# Patient Record
Sex: Female | Born: 1995 | Race: White | Hispanic: No | Marital: Single | Smoking: Never smoker
Health system: Southern US, Community
[De-identification: ages and names within clinical notes are randomized; demographics above are authoritative.]

## PROBLEM LIST (undated history)

## (undated) DIAGNOSIS — K219 Gastro-esophageal reflux disease without esophagitis: Secondary | ICD-10-CM

---

## 2019-06-05 ENCOUNTER — Encounter: Payer: Self-pay | Admitting: Emergency Medicine

## 2019-06-05 ENCOUNTER — Other Ambulatory Visit: Payer: Self-pay

## 2019-06-05 ENCOUNTER — Ambulatory Visit
Admission: EM | Admit: 2019-06-05 | Discharge: 2019-06-05 | Disposition: A | Discharge status: Home / Self Care | Attending: Emergency Medicine | Admitting: Emergency Medicine

## 2019-06-05 ENCOUNTER — Telehealth: Payer: Self-pay | Admitting: *Deleted

## 2019-06-05 DIAGNOSIS — R112 Nausea with vomiting, unspecified: Secondary | ICD-10-CM

## 2019-06-05 DIAGNOSIS — Z20822 Contact with and (suspected) exposure to covid-19: Secondary | ICD-10-CM

## 2019-06-05 DIAGNOSIS — Z3202 Encounter for pregnancy test, result negative: Secondary | ICD-10-CM

## 2019-06-05 DIAGNOSIS — Z7189 Other specified counseling: Secondary | ICD-10-CM

## 2019-06-05 HISTORY — DX: Gastro-esophageal reflux disease without esophagitis: K21.9

## 2019-06-05 LAB — PREGNANCY, URINE: Preg Test, Ur: NEGATIVE

## 2019-06-05 MED ORDER — ONDANSETRON 8 MG PO TBDP
8.0000 mg | ORAL_TABLET | Freq: Once | ORAL | Status: AC
  Administered 2019-06-05: 8 mg via ORAL

## 2019-06-05 MED ORDER — ONDANSETRON 4 MG PO TBDP: 4.0000 mg | ORAL_TABLET | Freq: Three times a day (TID) | ORAL | 0 refills | Status: DC | PRN

## 2019-06-05 NOTE — Discharge Instructions (Signed)
Take medication as prescribed. Rest. Drink plenty of fluids. Bland diet. Covid 19 testing today. Refer to [facility] information and follow. Remain home until results received. Over the counter pepcid for acid reflux as needed.   Follow up with your primary care physician this week. Return to Urgent care for new or worsening concerns.

## 2019-06-05 NOTE — Telephone Encounter (Signed)
Order placed for covid-19 testing. Patient instructions given. Appt today at the [facility] @ 10:30

## 2019-06-05 NOTE — ED Triage Notes (Signed)
Patient c/o nausea and vomiting off and on for over a week.  Patient denies fevers.

## 2019-06-05 NOTE — ED Provider Notes (Signed)
[facility] ____________________________________________  Time seen: Approximately 9:16 AM  I have reviewed the triage vital signs and the nursing notes.   HISTORY  Chief Complaint Nausea and Emesis   HPI Erika BiMcKenna Higgins is a 23 y.o. female has medical history of GERD, presenting for evaluation of nausea with vomiting today.  Patient reports the last 1 week she has had intermittent episodes of nausea, worse in the morning.  States today she had several back-to-back episodes of vomiting.  Last bowel movement was earlier today and described as loose but not diarrhea.  Has overall continued with normal bowel movements.  History of GERD with some similar presentation.  Does also work in [employer] around a lot of people.  Denies known food triggers.  Has had an occasional cough, but reports this is not abnormal for her.  Denies nasal congestion, sore throat, chest pain, shortness of breath.  States abdomen overall feels empty with intermittent discomfort and aching.  Denies current abdominal pain.  Denies abnormal color vomit or stool.  No dysuria.  Sexually active, has Implanon, denies pregnancy.  Has taken over-the-counter Tums with some improvement, denies other alleviating measures.Denies aggravating factors. Food doesn't taste as much. Drinking fluids well.   No LMP recorded. Patient has had an implant.   Past Medical History:  Diagnosis Date  . GERD (gastroesophageal reflux disease)     There are no active problems to display for this patient.   History reviewed. No pertinent surgical history.   No current facility-administered medications for this encounter.   Current Outpatient Medications:  .  etonogestrel (NEXPLANON) 68 MG IMPL implant, 1 each by Subdermal route once., Disp: , Rfl:  .  ondansetron (ZOFRAN ODT) 4 MG disintegrating tablet, Take 1 tablet (4 mg total) by mouth every 8 (eight) hours as needed for nausea or vomiting., Disp: 15 tablet, Rfl: 0   Allergies Patient has no known allergies.  Family History  Problem Relation Age of Onset  . Healthy Mother   . Healthy Father     Social History Social History   Tobacco Use  . Smoking status: Never Smoker  . Smokeless tobacco: Never Used  Substance Use Topics  . Alcohol use: Yes  . Drug use: Never    Review of Systems Constitutional: No fever ENT: No sore throat. Cardiovascular: Denies chest pain. Respiratory: Denies shortness of breath. Gastrointestinal: As above Genitourinary: Negative for dysuria. Musculoskeletal: Negative for back pain. Skin: Negative for rash. Neurological: Negative for headaches, focal weakness or numbness.   ____________________________________________   PHYSICAL EXAM:  VITAL SIGNS: ED Triage Vitals  Enc Vitals Group     BP 06/05/19 0901 123/81     Pulse Rate 06/05/19 0901 81     Resp 06/05/19 0901 16     Temp 06/05/19 0901 98.8 F (37.1 C)     Temp Source 06/05/19 0901 Oral     SpO2 06/05/19 0901 100 %     Weight 06/05/19 0856 159 lb (72.1 kg)     Height 06/05/19 0856 5\' 2"  (1.575 m)     Head Circumference --      Peak Flow --      Pain Score 06/05/19 0856 4     Pain Loc --      Pain Edu? --      Excl. in GC? --     Constitutional: Alert and oriented. Well appearing and in no acute distress. Eyes: Conjunctivae are normal.  ENT      Head: Normocephalic  and atraumatic.      Nose: No congestion      Mouth/Throat: Mucous membranes are moist.Oropharynx non-erythematous. Neck: No stridor. Supple without meningismus.  Hematological/Lymphatic/Immunilogical: No cervical lymphadenopathy. Cardiovascular: Normal rate, regular rhythm. Grossly normal heart sounds.  Good peripheral circulation. Respiratory: Normal respiratory effort without tachypnea nor retractions. Breath sounds are clear and equal bilaterally. No wheezes, rales, rhonchi. Gastrointestinal: Soft and nontender. Normal Bowel sounds. No CVA tenderness. Musculoskeletal:  No  midline cervical, thoracic or lumbar tenderness to palpation. Neurologic:  Normal speech and language.Speech is normal. No gait instability.  Skin:  Skin is warm, dry and intact. No rash noted. Psychiatric: Mood and affect are normal. Speech and behavior are normal. Patient exhibits appropriate insight and judgment   ___________________________________________   LABS (all labs ordered are listed, but only abnormal results are displayed)  Labs Reviewed  PREGNANCY, URINE   ____________________________________________   PROCEDURES Procedures    INITIAL IMPRESSION / ASSESSMENT AND PLAN / ED COURSE  Pertinent labs & imaging results that were available during my care of the patient were reviewed by me and considered in my medical decision making (see chart for details).  Well-appearing patient.  No acute distress.  Discussed multiple differentials with patient including viral gastroenteritis, GERD, pregnancy, COVID.  8 milligrams ODT Zofran given once in urgent care. Urine preg negative. Ordered Covid 19 testing. [place] information given and directed to follow. Supportive care. OTC pepcid as needed for GERD. Discussed indication, risks and benefits of medications with patient.  Discussed follow up with Primary care physician this week. Discussed follow up and return parameters including no resolution or any worsening concerns. Patient verbalized understanding and agreed to plan.   ____________________________________________   FINAL CLINICAL IMPRESSION(S) / ED DIAGNOSES  Final diagnoses:  Non-intractable vomiting with nausea, unspecified vomiting type  Advice Given About Covid-19 Virus Infection     ED Discharge Orders         Ordered    ondansetron (ZOFRAN ODT) 4 MG disintegrating tablet  Every 8 hours PRN     06/05/19 0944           Note: This dictation was prepared with Dragon dictation along with smaller phrase technology. Any transcriptional errors that result from  this process are unintentional.         Marylene Land, NP 06/05/19 [phone]

## 2019-06-05 NOTE — Telephone Encounter (Signed)
-----   Message from Marylene Land, NP sent at 06/05/2019  9:16 AM EDT ----- Regarding: covid 19 testing Nausea vomiting, decreased taste. Covid 19 testing

## 2019-06-06 LAB — NOVEL CORONAVIRUS, NAA: SARS-CoV-2, NAA: NOT DETECTED

## 2019-06-07 ENCOUNTER — Telehealth (HOSPITAL_COMMUNITY): Payer: Self-pay | Admitting: Emergency Medicine

## 2019-06-07 NOTE — Telephone Encounter (Signed)
Your test for COVID-19 was negative.  Please continue good preventive care measures, including:  frequent hand-washing, avoid touching your face, cover coughs/sneezes, stay out of crowds and keep a 6 foot distance from others.  If you develop fever/cough/breathlessness, please stay home for 10 days and until you have had 3 consecutive days with cough/breathlessness improving and without fever (without taking a fever reducer). Go to the nearest hospital ED tent for assessment if fever/cough/breathlessness are severe or illness seems like a threat to life..  Patient contacted and made aware of all results, all questions answered.  

## 2019-08-10 ENCOUNTER — Emergency Department

## 2019-08-10 ENCOUNTER — Ambulatory Visit
Admission: EM | Admit: 2019-08-10 | Discharge: 2019-08-10 | Disposition: A | Discharge status: Nursing Home (Includes ICF) | Source: Home / Self Care

## 2019-08-10 ENCOUNTER — Other Ambulatory Visit: Payer: Self-pay

## 2019-08-10 ENCOUNTER — Emergency Department
Admission: EM | Admit: 2019-08-10 | Discharge: 2019-08-10 | Disposition: A | Discharge status: Home / Self Care | Attending: Student in an Organized Health Care Education/Training Program | Admitting: Student in an Organized Health Care Education/Training Program

## 2019-08-10 DIAGNOSIS — R1011 Right upper quadrant pain: Secondary | ICD-10-CM | POA: Insufficient documentation

## 2019-08-10 DIAGNOSIS — E860 Dehydration: Secondary | ICD-10-CM | POA: Insufficient documentation

## 2019-08-10 DIAGNOSIS — R112 Nausea with vomiting, unspecified: Secondary | ICD-10-CM

## 2019-08-10 LAB — URINE DRUG SCREEN, QUALITATIVE (ARMC ONLY)
Amphetamines, Ur Screen: NOT DETECTED
Barbiturates, Ur Screen: NOT DETECTED
Benzodiazepine, Ur Scrn: NOT DETECTED
Cannabinoid 50 Ng, Ur ~~LOC~~: POSITIVE — AB
Cocaine Metabolite,Ur ~~LOC~~: NOT DETECTED
MDMA (Ecstasy)Ur Screen: NOT DETECTED
Methadone Scn, Ur: NOT DETECTED
Opiate, Ur Screen: NOT DETECTED
Phencyclidine (PCP) Ur S: NOT DETECTED
Tricyclic, Ur Screen: NOT DETECTED

## 2019-08-10 LAB — COMPREHENSIVE METABOLIC PANEL
ALT: 19 U/L (ref 0–44)
AST: 21 U/L (ref 15–41)
Albumin: 5.1 g/dL — ABNORMAL HIGH (ref 3.5–5.0)
Alkaline Phosphatase: 108 U/L (ref 38–126)
Anion gap: 12 (ref 5–15)
BUN: 21 mg/dL — ABNORMAL HIGH (ref 6–20)
CO2: 22 mmol/L (ref 22–32)
Calcium: 9.7 mg/dL (ref 8.9–10.3)
Chloride: 102 mmol/L (ref 98–111)
Creatinine, Ser: 0.73 mg/dL (ref 0.44–1.00)
GFR calc Af Amer: >60 mL/min (ref >60)
GFR calc non Af Amer: >60 mL/min (ref >60)
Glucose, Bld: 129 mg/dL — ABNORMAL HIGH (ref 70–99)
Potassium: 3.6 mmol/L (ref 3.5–5.1)
Sodium: 136 mmol/L (ref 135–145)
Total Bilirubin: 2 mg/dL — ABNORMAL HIGH (ref 0.3–1.2)
Total Protein: 8.8 g/dL — ABNORMAL HIGH (ref 6.5–8.1)

## 2019-08-10 LAB — CBC
HCT: 46.6 % — ABNORMAL HIGH (ref 36.0–46.0)
Hemoglobin: 16 g/dL — ABNORMAL HIGH (ref 12.0–15.0)
MCH: 29.4 pg (ref 26.0–34.0)
MCHC: 34.3 g/dL (ref 30.0–36.0)
MCV: 85.7 fL (ref 80.0–100.0)
Platelets: 320 10*3/uL (ref 150–400)
RBC: 5.44 MIL/uL — ABNORMAL HIGH (ref 3.87–5.11)
RDW: 12.4 % (ref 11.5–15.5)
WBC: 15.1 10*3/uL — ABNORMAL HIGH (ref 4.0–10.5)
nRBC: 0 % (ref 0.0–0.2)

## 2019-08-10 LAB — URINALYSIS, COMPLETE (UACMP) WITH MICROSCOPIC
Bacteria, UA: NONE SEEN
Bilirubin Urine: NEGATIVE
Glucose, UA: 150 mg/dL — AB
Ketones, ur: 80 mg/dL — AB
Leukocytes,Ua: NEGATIVE
Nitrite: NEGATIVE
Protein, ur: 30 mg/dL — AB
Specific Gravity, Urine: 1.028 (ref 1.005–1.030)
pH: 6 (ref 5.0–8.0)

## 2019-08-10 LAB — POCT PREGNANCY, URINE: Preg Test, Ur: NEGATIVE

## 2019-08-10 LAB — LIPASE, BLOOD: Lipase: 17 U/L (ref 11–51)

## 2019-08-10 MED ORDER — DEXTROSE 5 % AND 0.45 % NACL IV BOLUS
1000.0000 mL | Freq: Once | INTRAVENOUS | Status: AC
  Administered 2019-08-10: 13:00:00 1000 mL via INTRAVENOUS

## 2019-08-10 MED ORDER — PROMETHAZINE HCL 25 MG/ML IJ SOLN
12.5000 mg | Freq: Four times a day (QID) | INTRAMUSCULAR | Status: DC | PRN
  Administered 2019-08-10: 15:00:00 12.5 mg via INTRAVENOUS
  Filled 2019-08-10: qty 1

## 2019-08-10 MED ORDER — PANTOPRAZOLE SODIUM 40 MG IV SOLR
40.0000 mg | Freq: Once | INTRAVENOUS | Status: AC
  Administered 2019-08-10: 15:00:00 40 mg via INTRAVENOUS
  Filled 2019-08-10: qty 40

## 2019-08-10 MED ORDER — ONDANSETRON HCL 4 MG/2ML IJ SOLN
4.0000 mg | Freq: Once | INTRAMUSCULAR | Status: AC | PRN
  Administered 2019-08-10: 4 mg via INTRAVENOUS
  Filled 2019-08-10: qty 2

## 2019-08-10 MED ORDER — SODIUM CHLORIDE 0.9% FLUSH: 3.0000 mL | Freq: Once | INTRAVENOUS | Status: DC

## 2019-08-10 MED ORDER — SODIUM CHLORIDE 0.9 % IV BOLUS
1000.0000 mL | Freq: Once | INTRAVENOUS | Status: AC
  Administered 2019-08-10: 15:00:00 1000 mL via INTRAVENOUS

## 2019-08-10 MED ORDER — PROMETHAZINE HCL 12.5 MG PO TABS: 12.5000 mg | ORAL_TABLET | Freq: Four times a day (QID) | ORAL | 0 refills | Status: DC | PRN

## 2019-08-10 MED ORDER — HALOPERIDOL LACTATE 5 MG/ML IJ SOLN
5.0000 mg | Freq: Once | INTRAMUSCULAR | Status: AC
  Administered 2019-08-10: 17:00:00 5 mg via INTRAMUSCULAR
  Filled 2019-08-10: qty 1

## 2019-08-10 NOTE — ED Notes (Signed)
Pt given cup of water and prompted to try and drink lots of fluids.

## 2019-08-10 NOTE — ED Notes (Signed)
Pt groaning/moaning and turning legs side to side in bed. Significant other at bedside. Pt states has thrown up "too many times to count." Pt has emesis bag. Pt not currently vomiting but states nausea only slightly dec since arrival.

## 2019-08-10 NOTE — ED Triage Notes (Signed)
Pt arrives to ed via pov from home. Pt reports drink last night and been vomiting since, pt unable to keep anything down. Pt in floor in triage room over waste bin vomiting. Pt states she only drank 2 glasses of wine but doesn't normally drink. Pt states emesis now has a pink tinge. Pt able to speak in full sentences, no SOB noted, pt appears weak but a&o x 4 and able to follow command freely

## 2019-08-10 NOTE — ED Notes (Signed)
Pt relaxed. Resting comfortably in bed. Rails up. Bed locked low. Call bell within reach.

## 2019-08-10 NOTE — ED Notes (Signed)
Pt signed printed d/c paperwork.  

## 2019-08-10 NOTE — ED Notes (Signed)
Pt up to bedside toilet in attempt to provide urine sample.

## 2019-08-10 NOTE — ED Provider Notes (Signed)
[facility] Emergency Department Provider Note    First MD Initiated Contact with Patient 08/10/19 1415     (approximate)  I have reviewed the triage vital signs and the nursing notes.   HISTORY  Chief Complaint Dehydration and Emesis    HPI Erika Higgins is a 23 y.o. female with the below listed past medical history presents the ER for evaluation of crampy abdominal pain associated with nausea vomiting and decreased oral intake over the past 48 hours.  States that symptoms started after she had a sandwich for dinner and then 2 glasses of wine.  Has not had any diarrhea or measured fevers.  Does have some crampy right upper quadrant abdominal pain.  No lower quadrant pain.  Denies any discharge or dysuria.  Feels very weak and dehydrated.    Past Medical History:  Diagnosis Date   GERD (gastroesophageal reflux disease)    Family History  Problem Relation Age of Onset   Healthy Mother    Healthy Father    No past surgical history on file. There are no active problems to display for this patient.     Prior to Admission medications   Medication Sig Start Date End Date Taking? Authorizing Provider  etonogestrel (NEXPLANON) 68 MG IMPL implant 1 each by Subdermal route once.    [provider]  ondansetron (ZOFRAN ODT) 4 MG disintegrating tablet Take 1 tablet (4 mg total) by mouth every 8 (eight) hours as needed for nausea or vomiting. 06/05/19   Marylene Land, NP  promethazine (PHENERGAN) 12.5 MG tablet Take 1 tablet (12.5 mg total) by mouth every 6 (six) hours as needed. 08/10/19   Merlyn Lot, MD    Allergies Patient has no known allergies.    Social History Social History   Tobacco Use   Smoking status: Never Smoker   Smokeless tobacco: Never Used  Substance Use Topics   Alcohol use: Yes   Drug use: Never    Review of Systems Patient denies headaches, rhinorrhea, blurry vision, numbness, shortness of breath,  chest pain, edema, cough, abdominal pain, nausea, vomiting, diarrhea, dysuria, fevers, rashes or hallucinations unless otherwise stated above in HPI. ____________________________________________   PHYSICAL EXAM:  VITAL SIGNS: Vitals:   08/10/19 1731 08/10/19 1826  BP: (!) 140/96 (!) 117/57  Pulse: 61 83  Resp:  19  Temp:    SpO2: 99% 100%    Constitutional: Alert and oriented. Weak and dehydrated appearing Eyes: Conjunctivae are normal.  Head: Atraumatic. Nose: No congestion/rhinnorhea. Mouth/Throat: Mucous membranes are moist.   Neck: No stridor. Painless ROM.  Cardiovascular: Normal rate, regular rhythm. Grossly normal heart sounds.  Good peripheral circulation. Respiratory: Normal respiratory effort.  No retractions. Lungs CTAB. Gastrointestinal: Soft with mild ruq ttp. No distention. No abdominal bruits. No CVA tenderness. Genitourinary: deferred Musculoskeletal: No lower extremity tenderness nor edema.  No joint effusions. Neurologic:  Normal speech and language. No gross focal neurologic deficits are appreciated. No facial droop Skin:  Skin is warm, dry and intact. No rash noted. Psychiatric: Mood and affect are normal. Speech and behavior are normal.  ____________________________________________   LABS (all labs ordered are listed, but only abnormal results are displayed)  Results for orders placed or performed during the hospital encounter of 08/10/19 (from the past 24 hour(s))  Lipase, blood     Status: None   Collection Time: 08/10/19  1:08 PM  Result Value Ref Range   Lipase 17 11 - 51 U/L  Comprehensive metabolic panel  Status: Abnormal   Collection Time: 08/10/19  1:08 PM  Result Value Ref Range   Sodium 136 135 - 145 mmol/L   Potassium 3.6 3.5 - 5.1 mmol/L   Chloride 102 98 - 111 mmol/L   CO2 22 22 - 32 mmol/L   Glucose, Bld 129 (H) 70 - 99 mg/dL   BUN 21 (H) 6 - 20 mg/dL   Creatinine, Ser 1.610.73 0.44 - 1.00 mg/dL   Calcium 9.7 8.9 - 09.610.3 mg/dL    Total Protein 8.8 (H) 6.5 - 8.1 g/dL   Albumin 5.1 (H) 3.5 - 5.0 g/dL   AST 21 15 - 41 U/L   ALT 19 0 - 44 U/L   Alkaline Phosphatase 108 38 - 126 U/L   Total Bilirubin 2.0 (H) 0.3 - 1.2 mg/dL   GFR calc non Af Amer >60 >60 mL/min   GFR calc Af Amer >60 >60 mL/min   Anion gap 12 5 - 15  CBC     Status: Abnormal   Collection Time: 08/10/19  1:08 PM  Result Value Ref Range   WBC 15.1 (H) 4.0 - 10.5 K/uL   RBC 5.44 (H) 3.87 - 5.11 MIL/uL   Hemoglobin 16.0 (H) 12.0 - 15.0 g/dL   HCT 04.546.6 (H) 40.936.0 - 81.146.0 %   MCV 85.7 80.0 - 100.0 fL   MCH 29.4 26.0 - 34.0 pg   MCHC 34.3 30.0 - 36.0 g/dL   RDW 91.412.4 78.211.5 - 95.615.5 %   Platelets 320 150 - 400 K/uL   nRBC 0.0 0.0 - 0.2 %  Urinalysis, Complete w Microscopic     Status: Abnormal   Collection Time: 08/10/19  4:10 PM  Result Value Ref Range   Color, Urine YELLOW (A) YELLOW   APPearance CLOUDY (A) CLEAR   Specific Gravity, Urine 1.028 1.005 - 1.030   pH 6.0 5.0 - 8.0   Glucose, UA 150 (A) NEGATIVE mg/dL   Hgb urine dipstick MODERATE (A) NEGATIVE   Bilirubin Urine NEGATIVE NEGATIVE   Ketones, ur 80 (A) NEGATIVE mg/dL   Protein, ur 30 (A) NEGATIVE mg/dL   Nitrite NEGATIVE NEGATIVE   Leukocytes,Ua NEGATIVE NEGATIVE   RBC / HPF 0-5 0 - 5 RBC/hpf   WBC, UA 0-5 0 - 5 WBC/hpf   Bacteria, UA NONE SEEN NONE SEEN   Squamous Epithelial / LPF 0-5 0 - 5   Mucus PRESENT   Urine Drug Screen, Qualitative ([facility] only)     Status: Abnormal   Collection Time: 08/10/19  4:10 PM  Result Value Ref Range   Tricyclic, Ur Screen NONE DETECTED NONE DETECTED   Amphetamines, Ur Screen NONE DETECTED NONE DETECTED   MDMA (Ecstasy)Ur Screen NONE DETECTED NONE DETECTED   Cocaine Metabolite,Ur [place] NONE DETECTED NONE DETECTED   Opiate, Ur Screen NONE DETECTED NONE DETECTED   Phencyclidine (PCP) Ur S NONE DETECTED NONE DETECTED   Cannabinoid 50 Ng, Ur [place] POSITIVE (A) NONE DETECTED   Barbiturates, Ur Screen NONE DETECTED NONE DETECTED   Benzodiazepine, Ur Scrn NONE  DETECTED NONE DETECTED   Methadone Scn, Ur NONE DETECTED NONE DETECTED  Pregnancy, urine POC     Status: None   Collection Time: 08/10/19  4:16 PM  Result Value Ref Range   Preg Test, Ur NEGATIVE NEGATIVE   ____________________________________________ ________________________________  RADIOLOGY  I personally reviewed all radiographic images ordered to evaluate for the above acute complaints and reviewed radiology reports and findings.  These findings were personally discussed with the patient.  Please see medical record for radiology report.  ____________________________________________   PROCEDURES  Procedure(s) performed:  Procedures    Critical Care performed: no ____________________________________________   INITIAL IMPRESSION / ASSESSMENT AND PLAN / ED COURSE  Pertinent labs & imaging results that were available during my care of the patient were reviewed by me and considered in my medical decision making (see chart for details).   DDX: Dehydration, lecture light abnormality, DKA, hyperemesis, pregnancy, pancreatitis, alcoholic gastritis  Erika Higgins is a 23 y.o. who presents to the ED with symptoms as described above.  Patient with some mild right upper quadrant abdominal pain in the setting of her 2 days of nausea and vomiting.  No lower domino pain.  This not consistent with appendicitis.  Will give IV fluids as well as antiemetics.  Order ultrasound.  Will reassess.  Clinical Course as of Aug 09 1852  Sat Aug 10, 2019  1712 UDS is positive for cannabinoids.  I do suspect this is worsening her symptoms..  Repeat abdominal exam is soft and benign.  She is not showing any signs of sepsis.   [PR]  1722 Patient requesting additional antiemetic though she is not having any vomiting.  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   [PR]  1812 Patient received over 2 L of fluid.  She is not  having any dry heaving or vomiting for the past 5-1/2 hours.  Repeat abdominal exam is soft and benign.  At this point I do believe she is stable and appropriate for outpatient follow-up.   [PR]    Clinical Course User Index [PR] Willy Eddyobinson, Naphtali Riede, MD    The patient was evaluated in Emergency Department today for the symptoms described in the history of present illness. He/she was evaluated in the context of the global COVID-19 pandemic, which necessitated consideration that the patient might be at risk for infection with the SARS-CoV-2 virus that causes COVID-19. Institutional protocols and algorithms that pertain to the evaluation of patients at risk for COVID-19 are in a state of rapid change based on information released by regulatory bodies including the CDC and federal and state organizations. These policies and algorithms were followed during the patient's care in the ED.  As part of my medical decision making, I reviewed the following data within the electronic MEDICAL RECORD NUMBER Nursing notes reviewed and incorporated, Labs reviewed, notes from prior ED visits and [place] Controlled Substance Database   ____________________________________________   FINAL CLINICAL IMPRESSION(S) / ED DIAGNOSES  Final diagnoses:  RUQ pain  Non-intractable vomiting with nausea, unspecified vomiting type  Dehydration      NEW MEDICATIONS STARTED DURING THIS VISIT:  Discharge Medication List as of 08/10/2019  6:23 PM    START taking these medications   Details  promethazine (PHENERGAN) 12.5 MG tablet Take 1 tablet (12.5 mg total) by mouth every 6 (six) hours as needed., Starting Sat 08/10/2019, Normal         Note:  This document was prepared using Dragon voice recognition software and may include unintentional dictation errors.    Willy Eddyobinson, Sonja Manseau, MD 08/10/19 [phone]

## 2019-09-05 ENCOUNTER — Emergency Department: Payer: Self-pay

## 2019-09-05 ENCOUNTER — Other Ambulatory Visit: Payer: Self-pay

## 2019-09-05 ENCOUNTER — Emergency Department
Admission: EM | Admit: 2019-09-05 | Discharge: 2019-09-05 | Disposition: A | Discharge status: Home / Self Care | Payer: Self-pay | Attending: Emergency Medicine | Admitting: Emergency Medicine

## 2019-09-05 DIAGNOSIS — Z793 Long term (current) use of hormonal contraceptives: Secondary | ICD-10-CM | POA: Insufficient documentation

## 2019-09-05 DIAGNOSIS — R1115 Cyclical vomiting syndrome unrelated to migraine: Secondary | ICD-10-CM

## 2019-09-05 DIAGNOSIS — R1011 Right upper quadrant pain: Secondary | ICD-10-CM

## 2019-09-05 DIAGNOSIS — F12188 Cannabis abuse with other cannabis-induced disorder: Secondary | ICD-10-CM | POA: Insufficient documentation

## 2019-09-05 LAB — COMPREHENSIVE METABOLIC PANEL
ALT: 19 U/L (ref 0–44)
AST: 19 U/L (ref 15–41)
Albumin: 5.3 g/dL — ABNORMAL HIGH (ref 3.5–5.0)
Alkaline Phosphatase: 105 U/L (ref 38–126)
Anion gap: 12 (ref 5–15)
BUN: 18 mg/dL (ref 6–20)
CO2: 21 mmol/L — ABNORMAL LOW (ref 22–32)
Calcium: 10.2 mg/dL (ref 8.9–10.3)
Chloride: 103 mmol/L (ref 98–111)
Creatinine, Ser: 0.75 mg/dL (ref 0.44–1.00)
GFR calc Af Amer: >60 mL/min (ref >60)
GFR calc non Af Amer: >60 mL/min (ref >60)
Glucose, Bld: 121 mg/dL — ABNORMAL HIGH (ref 70–99)
Potassium: 3.4 mmol/L — ABNORMAL LOW (ref 3.5–5.1)
Sodium: 136 mmol/L (ref 135–145)
Total Bilirubin: 1.7 mg/dL — ABNORMAL HIGH (ref 0.3–1.2)
Total Protein: 8.6 g/dL — ABNORMAL HIGH (ref 6.5–8.1)

## 2019-09-05 LAB — URINALYSIS, COMPLETE (UACMP) WITH MICROSCOPIC
Bacteria, UA: NONE SEEN
Bilirubin Urine: NEGATIVE
Glucose, UA: NEGATIVE mg/dL
Hgb urine dipstick: NEGATIVE
Ketones, ur: 20 mg/dL — AB
Leukocytes,Ua: NEGATIVE
Nitrite: NEGATIVE
Protein, ur: NEGATIVE mg/dL
Specific Gravity, Urine: 1.013 (ref 1.005–1.030)
pH: 6 (ref 5.0–8.0)

## 2019-09-05 LAB — CBC
HCT: 45.7 % (ref 36.0–46.0)
Hemoglobin: 15.8 g/dL — ABNORMAL HIGH (ref 12.0–15.0)
MCH: 29.8 pg (ref 26.0–34.0)
MCHC: 34.6 g/dL (ref 30.0–36.0)
MCV: 86.1 fL (ref 80.0–100.0)
Platelets: 347 10*3/uL (ref 150–400)
RBC: 5.31 MIL/uL — ABNORMAL HIGH (ref 3.87–5.11)
RDW: 12.5 % (ref 11.5–15.5)
WBC: 15.9 10*3/uL — ABNORMAL HIGH (ref 4.0–10.5)
nRBC: 0 % (ref 0.0–0.2)

## 2019-09-05 LAB — LIPASE, BLOOD: Lipase: 19 U/L (ref 11–51)

## 2019-09-05 LAB — POCT PREGNANCY, URINE: Preg Test, Ur: NEGATIVE

## 2019-09-05 MED ORDER — ALUM & MAG HYDROXIDE-SIMETH 200-200-20 MG/5ML PO SUSP
30.0000 mL | Freq: Once | ORAL | Status: AC
  Administered 2019-09-05: 20:00:00 30 mL via ORAL
  Filled 2019-09-05: qty 30

## 2019-09-05 MED ORDER — PROCHLORPERAZINE EDISYLATE 10 MG/2ML IJ SOLN
10.0000 mg | Freq: Once | INTRAMUSCULAR | Status: AC
  Administered 2019-09-05: 10 mg via INTRAVENOUS
  Filled 2019-09-05: qty 2

## 2019-09-05 MED ORDER — POTASSIUM CHLORIDE 10 MEQ/100ML IV SOLN
10.0000 meq | Freq: Once | INTRAVENOUS | Status: AC
  Administered 2019-09-05: 10 meq via INTRAVENOUS
  Filled 2019-09-05: qty 100

## 2019-09-05 MED ORDER — ONDANSETRON HCL 4 MG/2ML IJ SOLN
4.0000 mg | Freq: Once | INTRAMUSCULAR | Status: AC
  Administered 2019-09-05: 4 mg via INTRAVENOUS
  Filled 2019-09-05: qty 2

## 2019-09-05 MED ORDER — HALOPERIDOL LACTATE 5 MG/ML IJ SOLN
5.0000 mg | Freq: Once | INTRAMUSCULAR | Status: AC
  Administered 2019-09-05: 19:00:00 5 mg via INTRAVENOUS
  Filled 2019-09-05: qty 1

## 2019-09-05 MED ORDER — LIDOCAINE VISCOUS HCL 2 % MT SOLN
15.0000 mL | Freq: Once | OROMUCOSAL | Status: AC
  Administered 2019-09-05: 20:00:00 15 mL via ORAL
  Filled 2019-09-05: qty 15

## 2019-09-05 MED ORDER — CAPSAICIN 0.025 % EX CREA
TOPICAL_CREAM | Freq: Three times a day (TID) | CUTANEOUS | Status: DC
  Filled 2019-09-05 (×2): qty 60

## 2019-09-05 MED ORDER — SODIUM CHLORIDE 0.9 % IV BOLUS
1000.0000 mL | Freq: Once | INTRAVENOUS | Status: AC
  Administered 2019-09-05: 17:00:00 1000 mL via INTRAVENOUS

## 2019-09-05 MED ORDER — SODIUM CHLORIDE 0.9 % IV BOLUS
1000.0000 mL | Freq: Once | INTRAVENOUS | Status: AC
  Administered 2019-09-05: 19:00:00 1000 mL via INTRAVENOUS

## 2019-09-05 MED ORDER — DIPHENHYDRAMINE HCL 50 MG/ML IJ SOLN
25.0000 mg | Freq: Once | INTRAMUSCULAR | Status: AC
  Administered 2019-09-05: 25 mg via INTRAVENOUS
  Filled 2019-09-05: qty 1

## 2019-09-05 MED ORDER — PROMETHAZINE HCL 25 MG PO TABS: 25.0000 mg | ORAL_TABLET | Freq: Four times a day (QID) | ORAL | 0 refills | Status: AC | PRN

## 2019-09-05 MED ORDER — CAPSAICIN 0.075 % EX CREA
TOPICAL_CREAM | Freq: Three times a day (TID) | CUTANEOUS | Status: DC
  Filled 2019-09-05 (×3): qty 60

## 2019-09-05 NOTE — ED Notes (Signed)
Pt aware urine sample needed 

## 2019-09-05 NOTE — ED Notes (Addendum)
Went to offer patient something to drink or eat and she states she drank some water and threw it all up. Pt reports "it hurts so bad my body is shaking", pt shaking in bed, VSS at this time, Dr. Ellender Hose informed

## 2019-09-05 NOTE — ED Notes (Addendum)
Pt sleeping when I walked into the room, significant other at bedside. Pt continuously takes bp cuff and pulse ox off

## 2019-09-05 NOTE — ED Notes (Signed)
Pt resting with eyes closed, resps even and unlabored 

## 2019-09-05 NOTE — ED Notes (Signed)
Unable to provide urine sample

## 2019-09-05 NOTE — ED Provider Notes (Signed)
[facility] Emergency Department Provider Note  ____________________________________________   First MD Initiated Contact with Patient 09/05/19 1835     (approximate)  I have reviewed the triage vital signs and the nursing notes.   HISTORY  Chief Complaint Emesis and Abdominal Pain    HPI Erika Higgins is a 23 y.o. female with past medical history as below here with nausea, vomiting, and abdominal pain.  Patient states that her symptoms started yesterday morning.  She states that she had a marijuana edible prior to this.  She states that since then, she said persistently worsening nausea, vomiting, and epigastric pain.  She been unable to eat or drink.  No diarrhea.  No fevers or chills.  The pain is primarily epigastric, occasionally radiates up towards the right upper quadrant.  She was seen previously for this, and was UDS positive, felt better after fluids and antiemetics.  No fevers.  No urinary symptoms.  No vaginal bleeding or discharge.        Past Medical History:  Diagnosis Date  . GERD (gastroesophageal reflux disease)     There are no active problems to display for this patient.   History reviewed. No pertinent surgical history.  Prior to Admission medications   Medication Sig Start Date End Date Taking? Authorizing Provider  etonogestrel (NEXPLANON) 68 MG IMPL implant 1 each by Subdermal route once.    [provider]  promethazine (PHENERGAN) 25 MG tablet Take 1 tablet (25 mg total) by mouth every 6 (six) hours as needed for nausea or vomiting. 09/05/19   Shaune Pollack, MD    Allergies Patient has no known allergies.  Family History  Problem Relation Age of Onset  . Healthy Mother   . Healthy Father     Social History Social History   Tobacco Use  . Smoking status: Never Smoker  . Smokeless tobacco: Never Used  Substance Use Topics  . Alcohol use: Yes  . Drug use: Never    Review of Systems  Review of  Systems  Constitutional: Positive for fatigue. Negative for fever.  HENT: Negative for congestion and sore throat.   Eyes: Negative for visual disturbance.  Respiratory: Negative for cough and shortness of breath.   Cardiovascular: Negative for chest pain.  Gastrointestinal: Positive for abdominal pain, nausea and vomiting. Negative for diarrhea.  Genitourinary: Negative for flank pain.  Musculoskeletal: Negative for back pain and neck pain.  Skin: Negative for rash and wound.  Neurological: Positive for weakness.  All other systems reviewed and are negative.    ____________________________________________  PHYSICAL EXAM:      VITAL SIGNS: ED Triage Vitals [09/05/19 1620]  Enc Vitals Group     BP 139/78     Pulse Rate 86     Resp 18     Temp 98.8 F (37.1 C)     Temp Source Oral     SpO2 100 %     Weight 154 lb 5.2 oz (70 kg)     Height 5\' 2"  (1.575 m)     Head Circumference      Peak Flow      Pain Score 4     Pain Loc      Pain Edu?      Excl. in [place]?      Physical Exam Vitals signs and nursing note reviewed.  Constitutional:      General: She is not in acute distress.    Appearance: She is well-developed.  HENT:  Head: Normocephalic and atraumatic.  Eyes:     Conjunctiva/sclera: Conjunctivae normal.  Neck:     Musculoskeletal: Neck supple.  Cardiovascular:     Rate and Rhythm: Normal rate and regular rhythm.     Heart sounds: Normal heart sounds. No murmur. No friction rub.  Pulmonary:     Effort: Pulmonary effort is normal. No respiratory distress.     Breath sounds: Normal breath sounds. No wheezing or rales.  Abdominal:     General: There is no distension.     Palpations: Abdomen is soft.     Tenderness: There is abdominal tenderness in the right upper quadrant and epigastric area. There is no guarding or rebound. Negative signs include Murphy's sign.  Skin:    General: Skin is warm.     Capillary Refill: Capillary refill takes less than 2  seconds.  Neurological:     Mental Status: She is alert and oriented to person, place, and time.     Motor: No abnormal muscle tone.       ____________________________________________   LABS (all labs ordered are listed, but only abnormal results are displayed)  Labs Reviewed  COMPREHENSIVE METABOLIC PANEL - Abnormal; Notable for the following components:      Result Value   Potassium 3.4 (*)    CO2 21 (*)    Glucose, Bld 121 (*)    Total Protein 8.6 (*)    Albumin 5.3 (*)    Total Bilirubin 1.7 (*)    All other components within normal limits  CBC - Abnormal; Notable for the following components:   WBC 15.9 (*)    RBC 5.31 (*)    Hemoglobin 15.8 (*)    All other components within normal limits  LIPASE, BLOOD  URINALYSIS, COMPLETE (UACMP) WITH MICROSCOPIC  POC URINE PREG, ED  POCT PREGNANCY, URINE    ____________________________________________  EKG: None ________________________________________  RADIOLOGY All imaging, including plain films, CT scans, and ultrasounds, independently reviewed by me, and interpretations confirmed via formal radiology reads.  ED MD interpretation:   Ultrasound: Negative  Official radiology report(s): US Abdomen Limited Ruq  Result Date: 09/05/2019 CLINICAL DATA:  Right upper quadrant pain. EXAM: ULTRASOUND ABDOMEN LIMITED RIGHT UPPER QUADRANT COMPARISON:  Ultrasound dated 08/10/2019 FINDINGS: Gallbladder: No gallstones or wall thickening visualized. No sonographic Murphy sign noted by sonographer. Common bile duct: Diameter: 4 mm, normal. Liver: No focal lesion identified. Within normal limits in parenchymal echogenicity. Portal vein is patent on color Doppler imaging with normal direction of blood flow towards the liver. Other: None. IMPRESSION: Normal. Electronically Signed   By: Lorriane Shire M.D.   On: 09/05/2019 18:22    ____________________________________________  PROCEDURES   Procedure(s) performed (including Critical  Care):  Procedures ____________________________________________  INITIAL IMPRESSION / MDM / Foley / ED COURSE  As part of my medical decision making, I reviewed the following data within the [place] was evaluated in Emergency Department on 09/05/2019 for the symptoms described in the history of present illness. She was evaluated in the context of the global COVID-19 pandemic, which necessitated consideration that the patient might be at risk for infection with the SARS-CoV-2 virus that causes COVID-19. Institutional protocols and algorithms that pertain to the evaluation of patients at risk for COVID-19 are in a state of rapid change based on information released by regulatory bodies including the CDC and federal and state organizations. These policies and algorithms were followed during  the patient's care in the ED.  Some ED evaluations and interventions may be delayed as a result of limited staffing during the pandemic.*   Clinical Course as of Sep 04 2128  Thu Sep 05, 2019  [id number] 23 year old female here with likely cannabis induced hyperemesis.  She has a moderate, likely reactive leukocytosis and appears mildly hemoconcentrated on lab work.  Patient given fluids.  Otherwise," ultrasound obtained in triage reviewed and is unremarkable.  The patient is otherwise well-appearing.  She has no lower abdominal tenderness to suggest appendicitis.  No peritoneal signs.  Will give Haldol, capsaicin, and reassess.   [CI]  2014 Patient initially slept for approximately an hour.  She then began to complain of pain and was noted to spit up.  No actual emesis noted.   [CI]  2021 Patient is now complaining of recurrent pain.  She was given IV meds and reported immediate resolution of pain.  Will continue to avoid narcotics in the setting of likely cyclical vomiting.  Plan to p.o. challenge.  She is able to tolerate p.o., can likely be discharged.  I called  in Phenergan to her pharmacy and she will be provided with capsaicin here.   [CI]    Clinical Course User Index [CI] Shaune PollackIsaacs, Pecolia Marando, MD    Medical Decision Making: As above.   ____________________________________________  FINAL CLINICAL IMPRESSION(S) / ED DIAGNOSES  Final diagnoses:  RUQ pain  Cannabis hyperemesis syndrome concurrent with and due to cannabis abuse (HCC)  Cyclical vomiting     MEDICATIONS GIVEN DURING THIS VISIT:  Medications  capsicum (ZOSTRIX) 0.075 % cream (has no administration in time range)  potassium chloride 10 mEq in 100 mL IVPB (10 mEq Intravenous New Bag/Given 09/05/19 2047)  ondansetron (ZOFRAN) injection 4 mg (4 mg Intravenous Given 09/05/19 1702)  sodium chloride 0.9 % bolus 1,000 mL (0 mLs Intravenous Stopped 09/05/19 1846)  haloperidol lactate (HALDOL) injection 5 mg (5 mg Intravenous Given 09/05/19 1851)  sodium chloride 0.9 % bolus 1,000 mL (0 mLs Intravenous Stopped 09/05/19 2121)  prochlorperazine (COMPAZINE) injection 10 mg (10 mg Intravenous Given 09/05/19 2008)  diphenhydrAMINE (BENADRYL) injection 25 mg (25 mg Intravenous Given 09/05/19 2008)  alum & mag hydroxide-simeth (MAALOX/MYLANTA) 200-200-20 MG/5ML suspension 30 mL (30 mLs Oral Given 09/05/19 2012)    And  lidocaine (XYLOCAINE) 2 % viscous mouth solution 15 mL (15 mLs Oral Given 09/05/19 2013)     ED Discharge Orders         Ordered    promethazine (PHENERGAN) 25 MG tablet  Every 6 hours PRN     09/05/19 1900           Note:  This document was prepared using Dragon voice recognition software and may include unintentional dictation errors.   Shaune PollackIsaacs, Rayden Dock, MD 09/05/19 2129

## 2019-09-05 NOTE — ED Notes (Signed)
Pt actively vomiting.

## 2019-09-05 NOTE — ED Triage Notes (Addendum)
Reports upper abdominal pain and emesis X 3 days. Recently seen for same. Pt alert and oriented X4, cooperative, RR even and unlabored, color WNL. Pt in NAD. Currently denies nausea.

## 2019-09-05 NOTE — Discharge Instructions (Signed)
When you begin feeling nauseous, take the Phenergan.  You can then apply the capsaicin cream to your abdomen.  Rest in a warm area.  This should help with your symptoms.  This should resolve over the next 24 hours.  Drink plenty of fluids, try to eat bland, non-spicy foods.

## 2019-09-05 NOTE — ED Notes (Signed)
Pt sitting in bed speaking with this RN in NAD, A&Ox4. Reports pain 6/10 in mid abdomen area and feels like "vomit is sitting on my throat"

## 2019-09-05 NOTE — ED Notes (Signed)
Pt reports improvement after receiving benadryl and compazine, reports 7/10 pain at this time. Pt reports she cannot drink GI cocktail at this time due to nausea

## 2019-09-09 ENCOUNTER — Other Ambulatory Visit: Payer: Self-pay

## 2019-09-09 ENCOUNTER — Emergency Department: Payer: Self-pay

## 2019-09-09 ENCOUNTER — Encounter: Payer: Self-pay | Admitting: Emergency Medicine

## 2019-09-09 ENCOUNTER — Emergency Department
Admission: EM | Admit: 2019-09-09 | Discharge: 2019-09-09 | Disposition: A | Discharge status: Home / Self Care | Payer: Self-pay | Attending: Emergency Medicine | Admitting: Emergency Medicine

## 2019-09-09 DIAGNOSIS — R1010 Upper abdominal pain, unspecified: Secondary | ICD-10-CM | POA: Insufficient documentation

## 2019-09-09 DIAGNOSIS — R1115 Cyclical vomiting syndrome unrelated to migraine: Secondary | ICD-10-CM | POA: Insufficient documentation

## 2019-09-09 DIAGNOSIS — Z79899 Other long term (current) drug therapy: Secondary | ICD-10-CM | POA: Insufficient documentation

## 2019-09-09 DIAGNOSIS — E876 Hypokalemia: Secondary | ICD-10-CM | POA: Insufficient documentation

## 2019-09-09 LAB — URINALYSIS, COMPLETE (UACMP) WITH MICROSCOPIC
Bacteria, UA: NONE SEEN
Bilirubin Urine: NEGATIVE
Glucose, UA: NEGATIVE mg/dL
Hgb urine dipstick: NEGATIVE
Ketones, ur: 80 mg/dL — AB
Leukocytes,Ua: NEGATIVE
Nitrite: NEGATIVE
Protein, ur: NEGATIVE mg/dL
Specific Gravity, Urine: 1.016 (ref 1.005–1.030)
pH: 6 (ref 5.0–8.0)

## 2019-09-09 LAB — COMPREHENSIVE METABOLIC PANEL
ALT: 25 U/L (ref 0–44)
AST: 21 U/L (ref 15–41)
Albumin: 5.3 g/dL — ABNORMAL HIGH (ref 3.5–5.0)
Alkaline Phosphatase: 110 U/L (ref 38–126)
Anion gap: 15 (ref 5–15)
BUN: 15 mg/dL (ref 6–20)
CO2: 21 mmol/L — ABNORMAL LOW (ref 22–32)
Calcium: 9.9 mg/dL (ref 8.9–10.3)
Chloride: 98 mmol/L (ref 98–111)
Creatinine, Ser: 0.92 mg/dL (ref 0.44–1.00)
GFR calc Af Amer: >60 mL/min (ref >60)
GFR calc non Af Amer: >60 mL/min (ref >60)
Glucose, Bld: 98 mg/dL (ref 70–99)
Potassium: 3.1 mmol/L — ABNORMAL LOW (ref 3.5–5.1)
Sodium: 134 mmol/L — ABNORMAL LOW (ref 135–145)
Total Bilirubin: 4.5 mg/dL — ABNORMAL HIGH (ref 0.3–1.2)
Total Protein: 8.5 g/dL — ABNORMAL HIGH (ref 6.5–8.1)

## 2019-09-09 LAB — CBC
HCT: 47.4 % — ABNORMAL HIGH (ref 36.0–46.0)
Hemoglobin: 16.8 g/dL — ABNORMAL HIGH (ref 12.0–15.0)
MCH: 29.2 pg (ref 26.0–34.0)
MCHC: 35.4 g/dL (ref 30.0–36.0)
MCV: 82.4 fL (ref 80.0–100.0)
Platelets: 317 10*3/uL (ref 150–400)
RBC: 5.75 MIL/uL — ABNORMAL HIGH (ref 3.87–5.11)
RDW: 12.1 % (ref 11.5–15.5)
WBC: 11 10*3/uL — ABNORMAL HIGH (ref 4.0–10.5)
nRBC: 0 % (ref 0.0–0.2)

## 2019-09-09 LAB — POCT PREGNANCY, URINE: Preg Test, Ur: NEGATIVE

## 2019-09-09 LAB — LIPASE, BLOOD: Lipase: 18 U/L (ref 11–51)

## 2019-09-09 MED ORDER — DROPERIDOL 2.5 MG/ML IJ SOLN
2.5000 mg | Freq: Once | INTRAMUSCULAR | Status: AC
  Administered 2019-09-09: 05:00:00 2.5 mg via INTRAVENOUS
  Filled 2019-09-09: qty 2

## 2019-09-09 MED ORDER — POTASSIUM CHLORIDE 20 MEQ PO PACK
40.0000 meq | PACK | ORAL | Status: AC
  Administered 2019-09-09: 08:00:00 40 meq via ORAL
  Filled 2019-09-09: qty 2

## 2019-09-09 MED ORDER — POTASSIUM CHLORIDE CRYS ER 20 MEQ PO TBCR: 20.0000 meq | EXTENDED_RELEASE_TABLET | Freq: Every day | ORAL | 0 refills | Status: AC

## 2019-09-09 MED ORDER — IOHEXOL 300 MG/ML  SOLN
100.0000 mL | Freq: Once | INTRAMUSCULAR | Status: AC | PRN
  Administered 2019-09-09: 100 mL via INTRAVENOUS

## 2019-09-09 MED ORDER — SODIUM CHLORIDE 0.9 % IV BOLUS
1000.0000 mL | Freq: Once | INTRAVENOUS | Status: AC
  Administered 2019-09-09: 1000 mL via INTRAVENOUS

## 2019-09-09 MED ORDER — ONDANSETRON HCL 4 MG/2ML IJ SOLN
4.0000 mg | Freq: Once | INTRAMUSCULAR | Status: AC | PRN
  Administered 2019-09-09: 04:00:00 4 mg via INTRAVENOUS
  Filled 2019-09-09: qty 2

## 2019-09-09 MED ORDER — SODIUM CHLORIDE 0.9 % IV BOLUS: 1000.0000 mL | Freq: Once | INTRAVENOUS | Status: DC

## 2019-09-09 NOTE — ED Notes (Signed)
Patient ambulatory to lobby with steady gait and NAD noted. Verbalized understanding of discharge instructions and follow-up care.  

## 2019-09-09 NOTE — ED Notes (Signed)
Patient resting quietly with eyes closed in no acute distress.  

## 2019-09-09 NOTE — ED Triage Notes (Signed)
Pt arrives with c/o upper abd pain accompanied by N/V; pt has had symptoms for 6-7 days; pt has had intermittent issues with same symptoms since June, was just here on the 17th; last vomited about 30 minutes pta; pt says she's taking laxatives right now because she doesn't feel like she's been going regularly; says just having runny stool at this time says urine is dark; says she is unable to keep anything down;

## 2019-09-09 NOTE — ED Notes (Signed)
Patient unhooked from monitor to ambulate to restroom. Steady gait and NAD noted. Denies nausea or new pain since drinking water.

## 2019-09-09 NOTE — ED Notes (Signed)
ED Provider at bedside. 

## 2019-09-09 NOTE — ED Provider Notes (Signed)
[facility] Emergency Department Provider Note  ____________________________________________   First MD Initiated Contact with Patient 09/09/19 0423     (approximate)  I have reviewed the triage vital signs and the nursing notes.   HISTORY  Chief Complaint Emesis    HPI Erika Higgins is a 23 y.o. female with medical history as listed below but also with recent ED visit for similar symptoms.  She presents tonight for evaluation of persistent nausea and vomiting that has been going on more or less for a week.  She got a little bit better after her last visit to the emergency department but then her symptoms have worsened again within the last 24 hours.  Nothing particular makes it better or worse but she says she feels very dehydrated and has been unable to keep down much of anything in terms of fluids or food.  She does not have any pain except for when she is vomiting and then she feels some upper abdominal discomfort.  She denies any contact with COVID-19 patients.  She denies fever/chills, sore throat, chest pain, shortness of breath, cough, and dysuria.  It was originally considered that she might have cannabinoid hyperemesis syndrome, but she swears that the last time she used any marijuana was an edible that she took about a week ago when the symptoms first started and she has not had any marijuana since then.  She denies any other drug or alcohol use.        Past Medical History:  Diagnosis Date  . GERD (gastroesophageal reflux disease)     There are no active problems to display for this patient.   History reviewed. No pertinent surgical history.  Prior to Admission medications   Medication Sig Start Date End Date Taking? Authorizing Provider  etonogestrel (NEXPLANON) 68 MG IMPL implant 1 each by Subdermal route once.    [provider]  potassium chloride SA (KLOR-CON M20) 20 MEQ tablet Take 1 tablet (20 mEq total) by mouth daily.  09/09/19   Loleta Rose, MD  promethazine (PHENERGAN) 25 MG tablet Take 1 tablet (25 mg total) by mouth every 6 (six) hours as needed for nausea or vomiting. 09/05/19   Shaune Pollack, MD    Allergies Patient has no known allergies.  Family History  Problem Relation Age of Onset  . Healthy Mother   . Healthy Father     Social History Social History   Tobacco Use  . Smoking status: Never Smoker  . Smokeless tobacco: Never Used  Substance Use Topics  . Alcohol use: Yes  . Drug use: Never    Review of Systems Constitutional: No fever/chills Eyes: No visual changes. ENT: No sore throat. Cardiovascular: Denies chest pain. Respiratory: Denies shortness of breath. Gastrointestinal: Nausea and vomiting, no significant abdominal pain. Genitourinary: Negative for dysuria. Musculoskeletal: Negative for neck pain.  Negative for back pain. Integumentary: Negative for rash. Neurological: Negative for headaches, focal weakness or numbness.   ____________________________________________   PHYSICAL EXAM:  VITAL SIGNS: ED Triage Vitals  Enc Vitals Group     BP 09/09/19 0327 132/80     Pulse Rate 09/09/19 0327 80     Resp 09/09/19 0327 17     Temp 09/09/19 0327 98.2 F (36.8 C)     Temp Source 09/09/19 0327 Oral     SpO2 09/09/19 0327 99 %     Weight 09/09/19 0323 71.2 kg (157 lb)     Height 09/09/19 0323 1.575 m (5\' 2" )  Head Circumference --      Peak Flow --      Pain Score 09/09/19 0323 5     Pain Loc --      Pain Edu? --      Excl. in [place]? --     Constitutional: Alert and oriented.  Appears uncomfortable but nontoxic. Eyes: Conjunctivae are normal.  Head: Atraumatic.  Nose: No congestion/rhinnorhea. Mouth/Throat: Mucous membranes are dry.  Neck: No stridor.  No meningeal signs.   Cardiovascular: Normal rate, regular rhythm. Good peripheral circulation. Grossly normal heart sounds. Respiratory: Normal respiratory effort.  No retractions. Gastrointestinal: Soft  and nontender. No distention.  Musculoskeletal: No lower extremity tenderness nor edema. No gross deformities of extremities. Neurologic:  Normal speech and language. No gross focal neurologic deficits are appreciated.  Skin:  Skin is warm, dry and intact. Psychiatric: Mood and affect are normal. Speech and behavior are normal.  ____________________________________________   LABS (all labs ordered are listed, but only abnormal results are displayed)  Labs Reviewed  COMPREHENSIVE METABOLIC PANEL - Abnormal; Notable for the following components:      Result Value   Sodium 134 (*)    Potassium 3.1 (*)    CO2 21 (*)    Total Protein 8.5 (*)    Albumin 5.3 (*)    Total Bilirubin 4.5 (*)    All other components within normal limits  CBC - Abnormal; Notable for the following components:   WBC 11.0 (*)    RBC 5.75 (*)    Hemoglobin 16.8 (*)    HCT 47.4 (*)    All other components within normal limits  URINALYSIS, COMPLETE (UACMP) WITH MICROSCOPIC - Abnormal; Notable for the following components:   Color, Urine YELLOW (*)    APPearance HAZY (*)    Ketones, ur 80 (*)    All other components within normal limits  LIPASE, BLOOD  POCT PREGNANCY, URINE  POC URINE PREG, ED   ____________________________________________  EKG  ED ECG REPORT I, Hinda Kehr, the attending physician, personally viewed and interpreted this ECG.  Date: 09/09/2019 EKG Time: 3:48 AM Rate: 83 Rhythm: normal sinus rhythm QRS Axis: Borderline right axis deviation Intervals: normal, QTC 439 ms ST/T Wave abnormalities: Non-specific ST segment / T-wave changes, but no clear evidence of acute ischemia. Narrative Interpretation: no definitive evidence of acute ischemia; does not meet STEMI criteria.   ____________________________________________  RADIOLOGY I, Hinda Kehr, personally viewed and evaluated these images (plain radiographs) as part of my medical decision making, as well as reviewing the written  report by the radiologist.  ED MD interpretation:  No acute abnormalities  Official radiology report(s): Ct Abdomen Pelvis W Contrast  Result Date: 09/09/2019 CLINICAL DATA:  Nausea and vomiting with acute generalized abdominal pain EXAM: CT ABDOMEN AND PELVIS WITH CONTRAST TECHNIQUE: Multidetector CT imaging of the abdomen and pelvis was performed using the standard protocol following bolus administration of intravenous contrast. CONTRAST:  16mL OMNIPAQUE IOHEXOL 300 MG/ML  SOLN COMPARISON:  None. FINDINGS: Lower chest:  No contributory findings. Hepatobiliary: No focal liver abnormality.No evidence of biliary obstruction or stone. Pancreas: Unremarkable. Spleen: Unremarkable. Adrenals/Urinary Tract: Negative adrenals. No hydronephrosis or stone. Cortical scarring at the left upper more than lower pole. Unremarkable bladder. Stomach/Bowel:  No obstruction. No appendicitis. Vascular/Lymphatic: No acute vascular abnormality. No mass or adenopathy. Reproductive:No pathologic findings. Other: No ascites or pneumoperitoneum. Musculoskeletal: No acute abnormalities. IMPRESSION: 1. No acute finding. 2. Patchy left renal cortical scarring. Electronically Signed   By: Angelica Chessman  Watts M.D.   On: 09/09/2019 06:31    ____________________________________________   PROCEDURES   Procedure(s) performed (including Critical Care):  Procedures   ____________________________________________   INITIAL IMPRESSION / MDM / ASSESSMENT AND PLAN / ED COURSE  As part of my medical decision making, I reviewed the following data within the electronic MEDICAL RECORD NUMBER Nursing notes reviewed and incorporated, Labs reviewed , Old chart reviewed and Notes from prior ED visits   Differential diagnosis includes, but is not limited to, cyclic vomiting syndrome, cannabinoid hyperemesis syndrome, gastroparesis, SBO/ileus, biliary disease.  On the patient's last visit she had essentially normal labs and a normal right upper  quadrant ultrasound.  Tonight her vital signs are stable and she is not tachycardic but she has a potassium of 3.1 and a T bili of 4.5 which could represent WoodallGilbert disease or transiently elevated due to her vomiting given otherwise normal LFTs, but it deserves some consideration.  CBC is essentially normal with only very slight elevation of her white blood cell count.  Lipase is normal.  Given the normal ultrasound previously and the persistent symptoms, I will proceed with a CT scan of the Abdomen and pelvis with IV contrast.  She received some Zofran but it did not seem to help her symptoms so I am providing droperidol 2.5 mg IV as well as 1 L of IV fluids.  She has not any pain and has no tenderness to palpation.  Urine pregnancy test negative and urinalysis is pending.      Clinical Course as of Sep 09 727  Mon Sep 09, 2019  [id number] Total Bilirubin(!): 4.5 [CF]  0547 Potassium(!): 3.1 [CF]  0641 No evidence of urinary tract infection but the patient does have 80 ketones in her urine, I am providing another liter normal saline IV bolus.  Urinalysis, Complete w Microscopic(!) [CF]  [id number] CT scan is unremarkable with no evidence of acute infection or explanation for the patient's symptoms.  CT ABDOMEN PELVIS W CONTRAST [CF]    Clinical Course User Index [CF] Loleta RoseForbach, Judie Hollick, MD     ____________________________________________  FINAL CLINICAL IMPRESSION(S) / ED DIAGNOSES  Final diagnoses:  Cyclic vomiting syndrome  Hypokalemia due to excessive gastrointestinal loss of potassium     MEDICATIONS GIVEN DURING THIS VISIT:  Medications  sodium chloride 0.9 % bolus 1,000 mL (1,000 mLs Intravenous Refused 09/09/19 0710)  potassium chloride (KLOR-CON) packet 40 mEq (has no administration in time range)  ondansetron (ZOFRAN) injection 4 mg (4 mg Intravenous Given 09/09/19 0356)  droperidol (INAPSINE) 2.5 MG/ML injection 2.5 mg (2.5 mg Intravenous Given 09/09/19 0521)  sodium chloride 0.9 % bolus  1,000 mL (0 mLs Intravenous Stopped 09/09/19 0710)  iohexol (OMNIPAQUE) 300 MG/ML solution 100 mL (100 mLs Intravenous Contrast Given 09/09/19 0602)     ED Discharge Orders         Ordered    potassium chloride SA (KLOR-CON M20) 20 MEQ tablet  Daily     09/09/19 0727          *Please note:  Enzo BiMcKenna Sledge was evaluated in Emergency Department on 09/09/2019 for the symptoms described in the history of present illness. She was evaluated in the context of the global COVID-19 pandemic, which necessitated consideration that the patient might be at risk for infection with the SARS-CoV-2 virus that causes COVID-19. Institutional protocols and algorithms that pertain to the evaluation of patients at risk for COVID-19 are in a state of rapid change based on information released by regulatory bodies  including the CDC and federal and state organizations. These policies and algorithms were followed during the patient's care in the ED.  Some ED evaluations and interventions may be delayed as a result of limited staffing during the pandemic.*  Note:  This document was prepared using Dragon voice recognition software and may include unintentional dictation errors.   Loleta RoseForbach, Clarity Ciszek, MD 09/09/19 [phone]

## 2019-09-09 NOTE — ED Notes (Signed)
Patient given water for PO challenge. Will continue to monitor for nausea and pain.

## 2019-10-22 ENCOUNTER — Other Ambulatory Visit: Payer: Self-pay

## 2019-10-22 DIAGNOSIS — Z20822 Contact with and (suspected) exposure to covid-19: Secondary | ICD-10-CM

## 2019-10-23 ENCOUNTER — Telehealth: Payer: Self-pay | Admitting: General Practice

## 2019-10-23 LAB — NOVEL CORONAVIRUS, NAA: SARS-CoV-2, NAA: NOT DETECTED

## 2019-10-23 NOTE — Telephone Encounter (Signed)
Negative COVID results given. Patient results "NOT Detected." Caller expressed understanding. ° °

## 2019-12-14 IMAGING — US ULTRASOUND ABDOMEN LIMITED
1 series · 14 of 25 positions shown · non-contrast
Comparison: None.

CLINICAL DATA: Right upper quadrant pain x1 day

EXAM:
ULTRASOUND ABDOMEN LIMITED RIGHT UPPER QUADRANT

[Series 1: ultrasound abdomen limited · 39 acquisitions, 14 frames shown]
[im 1/39]
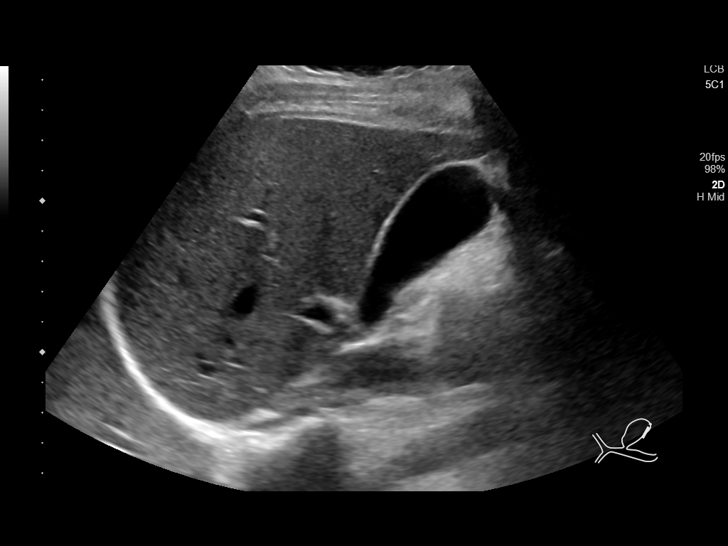
[im 4/39]
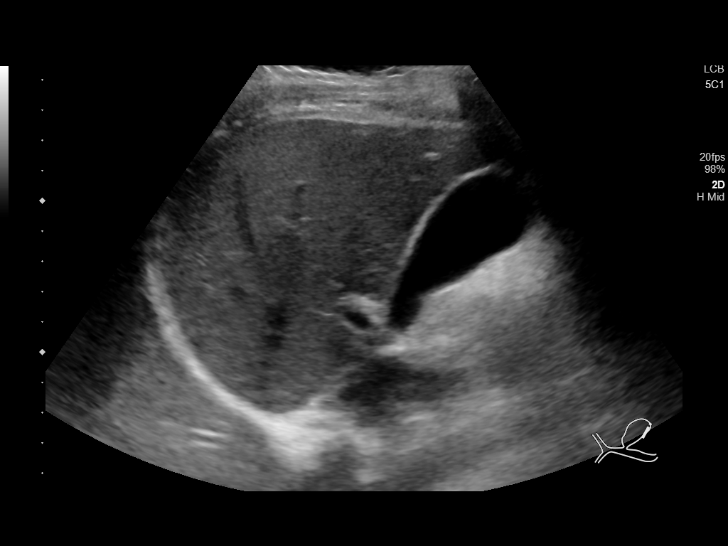
[im 7/39]
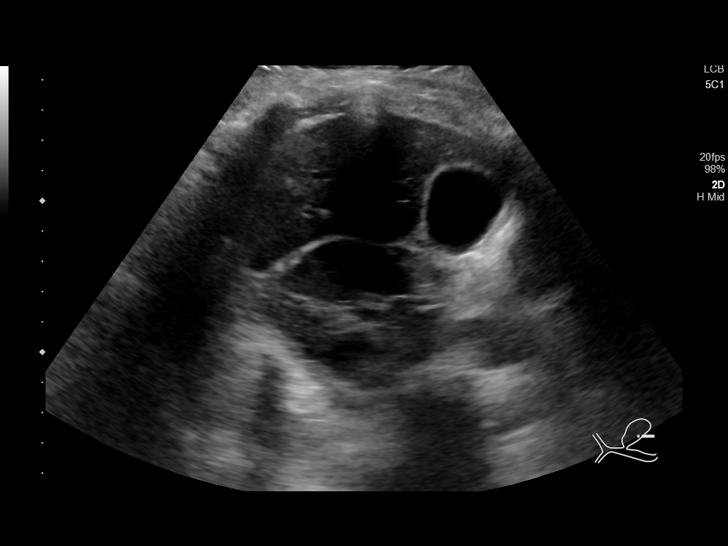
[im 10/39]
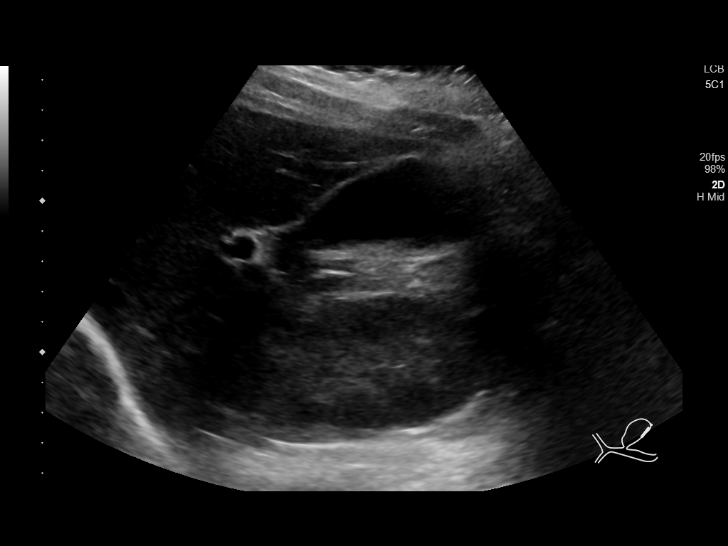
[im 13/39]
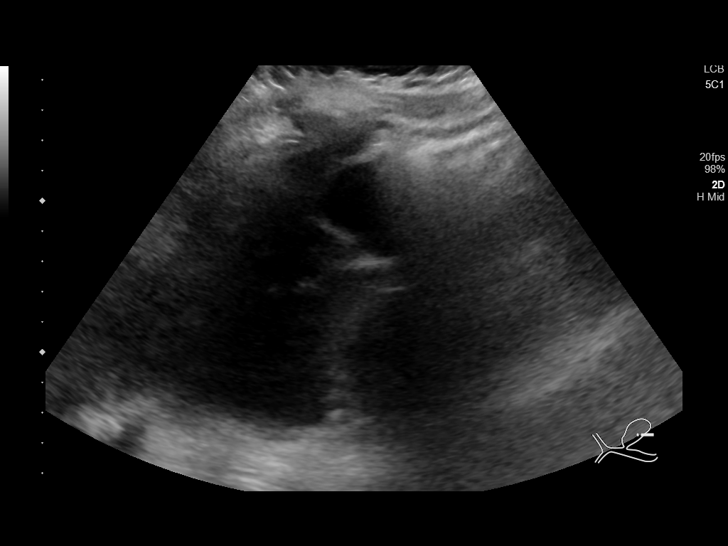
[im 15/39]
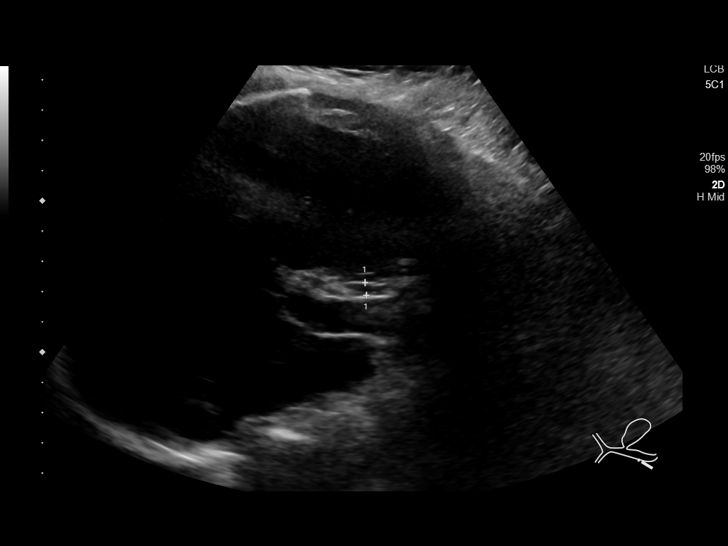
[im 18/39]
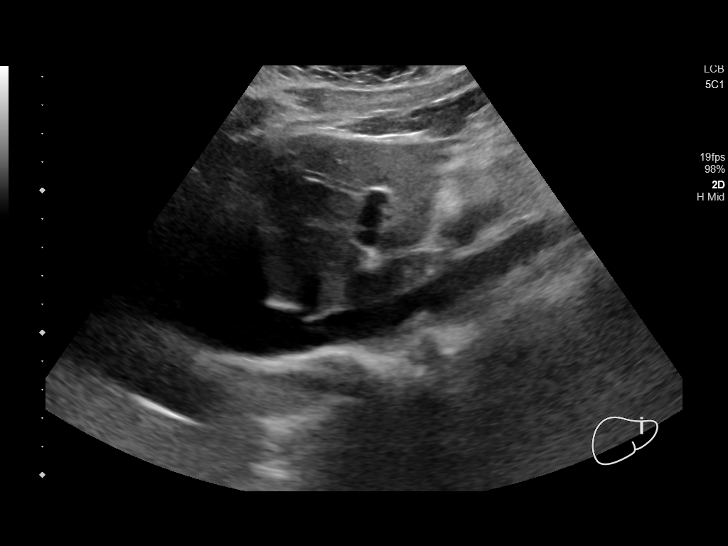
[im 21/39]
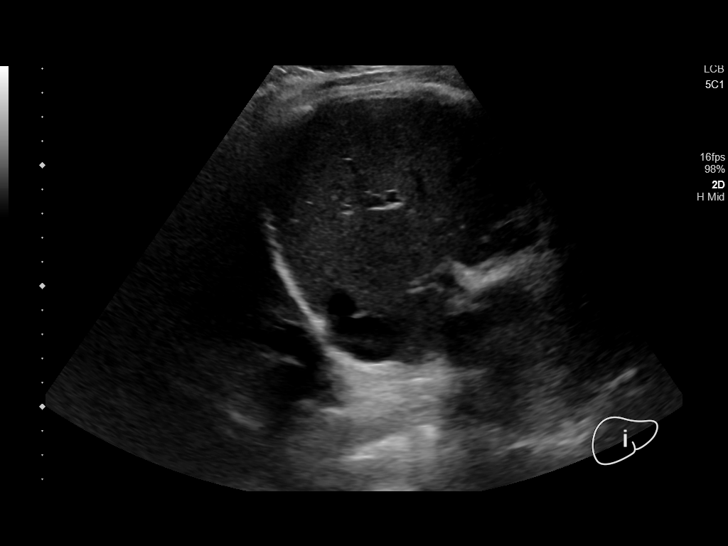
[im 24/39]
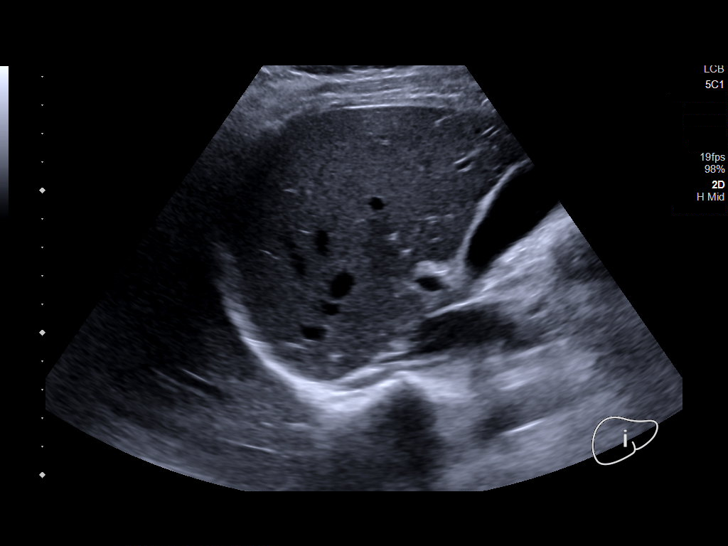
[im 26/39]
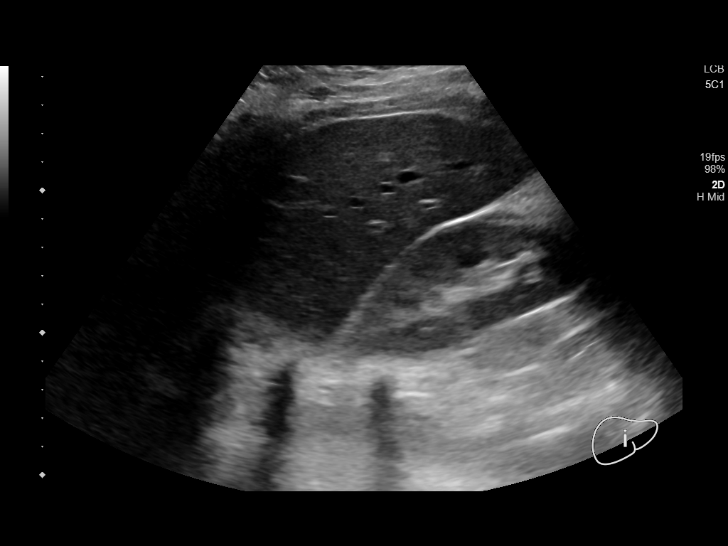
[im 29/39]
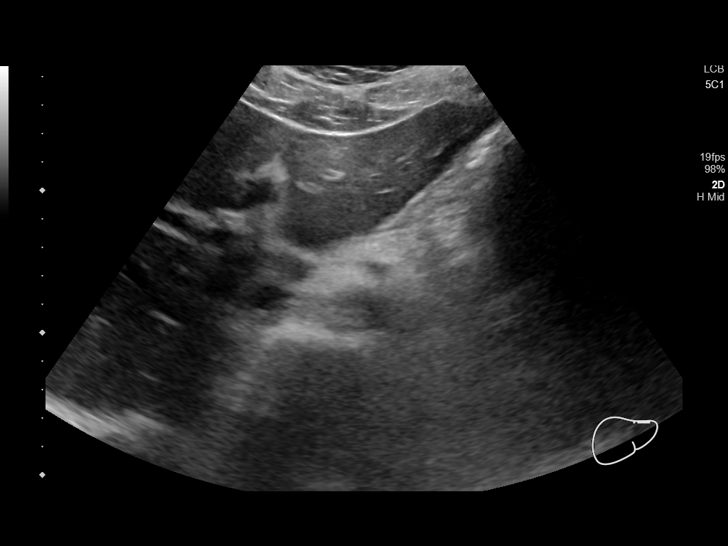
[im 32/39]
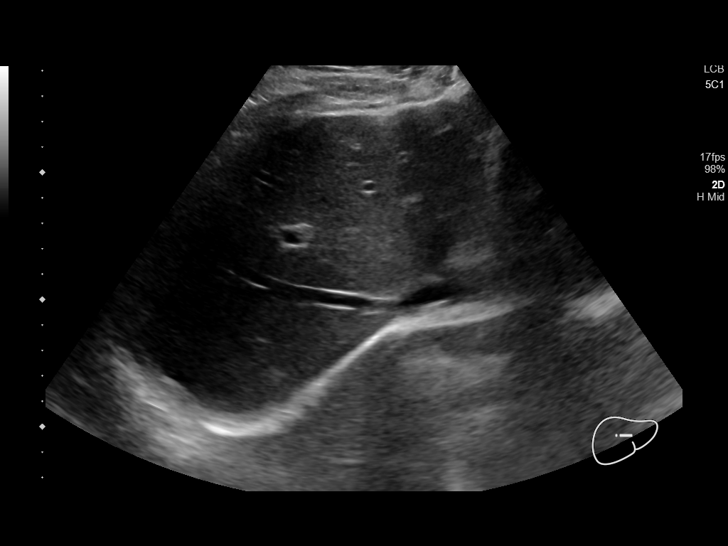
[im 35/39]
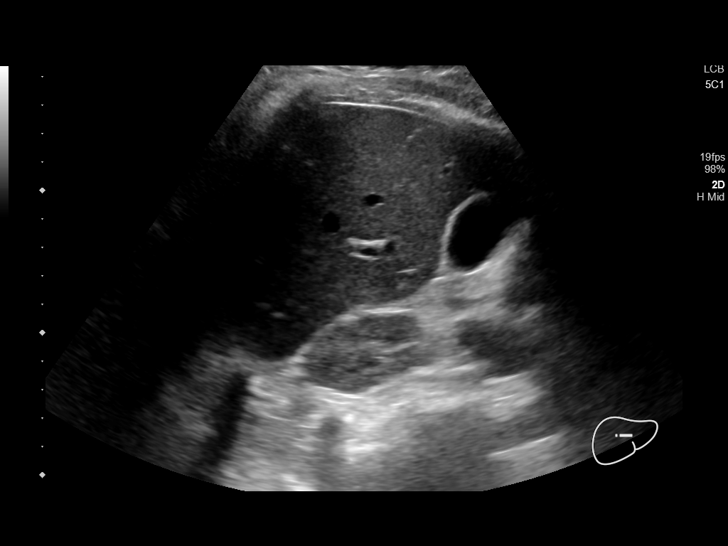
[im 39/39]
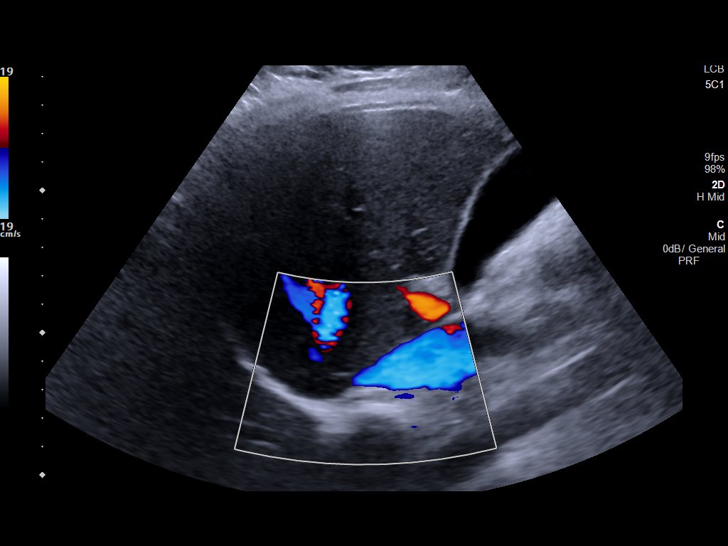

[14 of 25 positions shown; findings below may reference images not displayed]

FINDINGS: Gallbladder:

No gallstones, gallbladder wall thickening, or pericholecystic
fluid. Negative sonographic Murphy's sign.

Common bile duct:

Diameter: 4 mm

Liver:

No focal lesion identified. Within normal limits in parenchymal
echogenicity. Portal vein is patent on color Doppler imaging with
normal direction of blood flow towards the liver.

Other: None.
IMPRESSION: Negative right upper quadrant ultrasound.

## 2020-01-09 IMAGING — US US ABDOMEN LIMITED
1 series · 14 of 25 positions shown · non-contrast
Comparison: Ultrasound dated 08/10/2019

CLINICAL DATA: Right upper quadrant pain.

EXAM:
ULTRASOUND ABDOMEN LIMITED RIGHT UPPER QUADRANT

[Series 1: us abdomen limited · 14 of 63 slices shown]
[im 1/63]
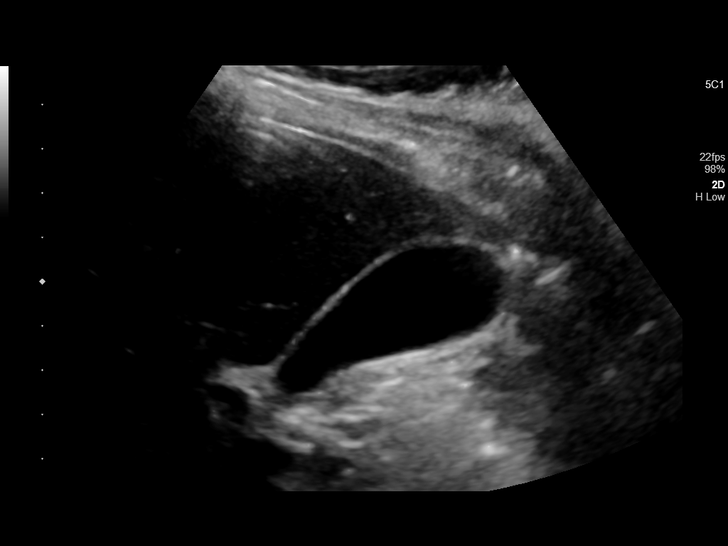
[im 6/63]
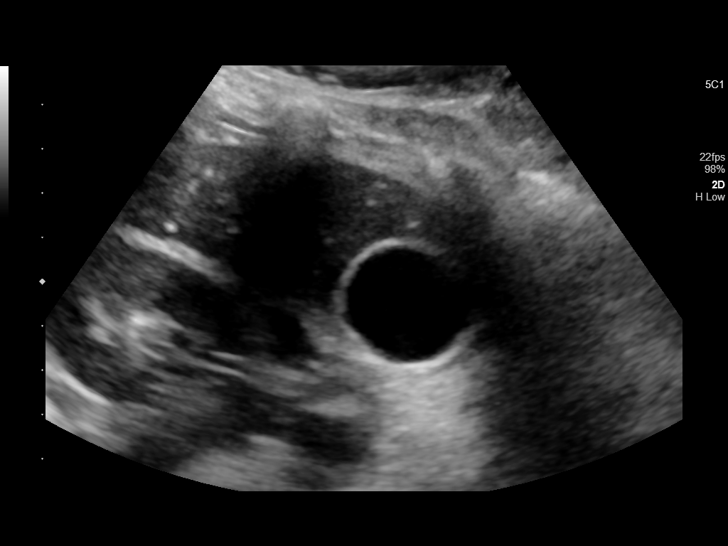
[im 11/63]
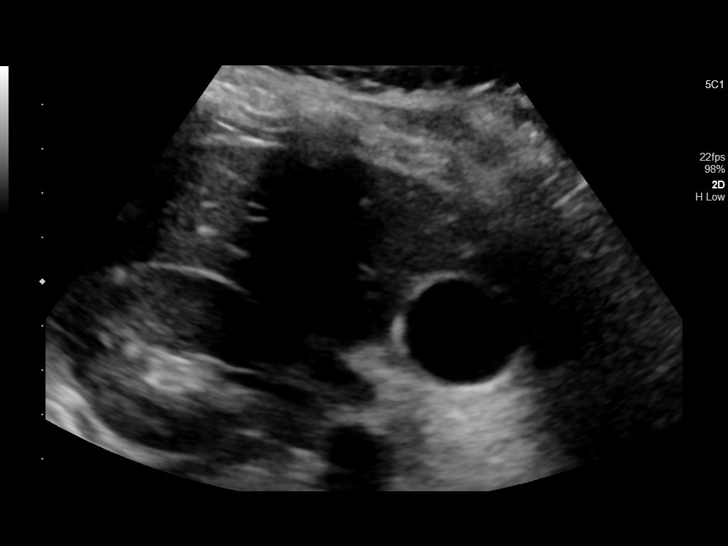
[im 16/63]
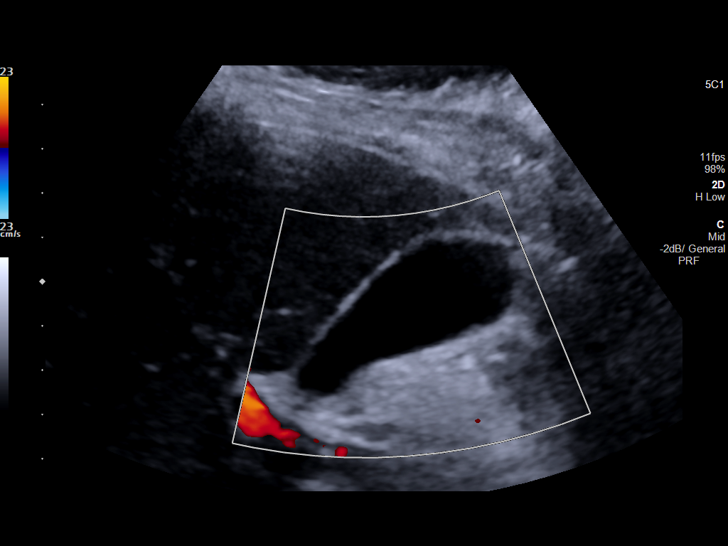
[im 21/63]
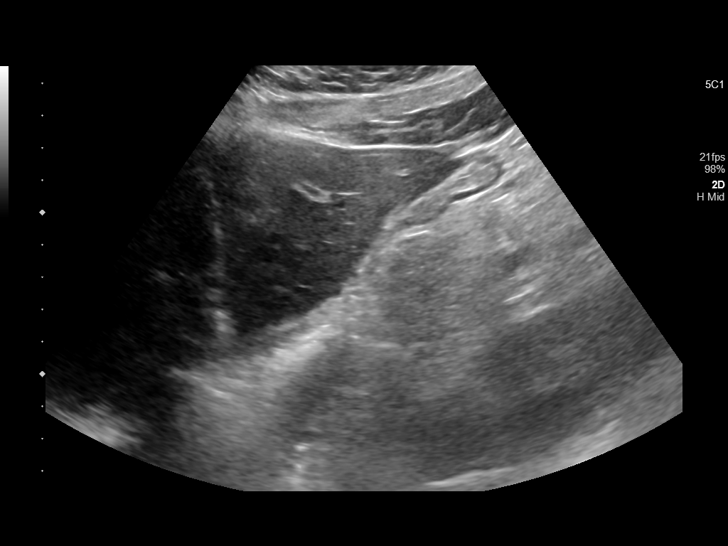
[im 24/63]
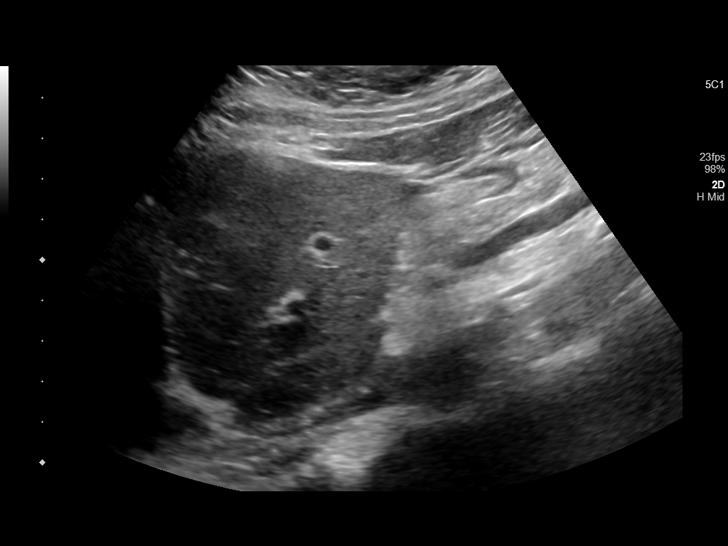
[im 29/63]
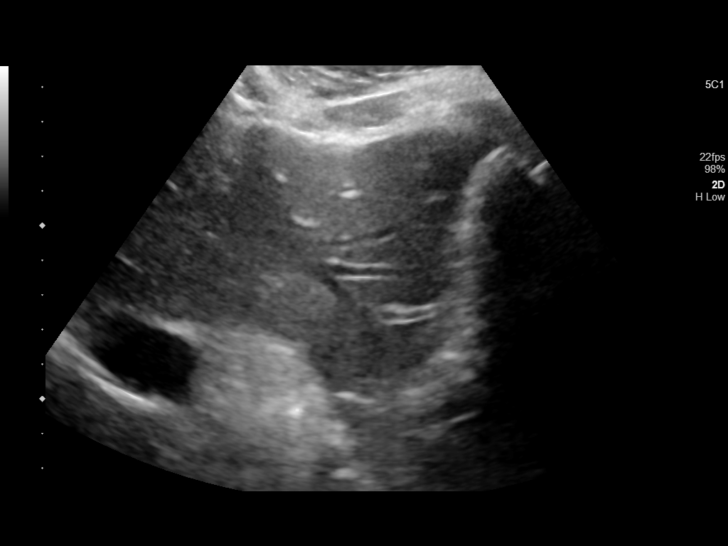
[im 34/63]
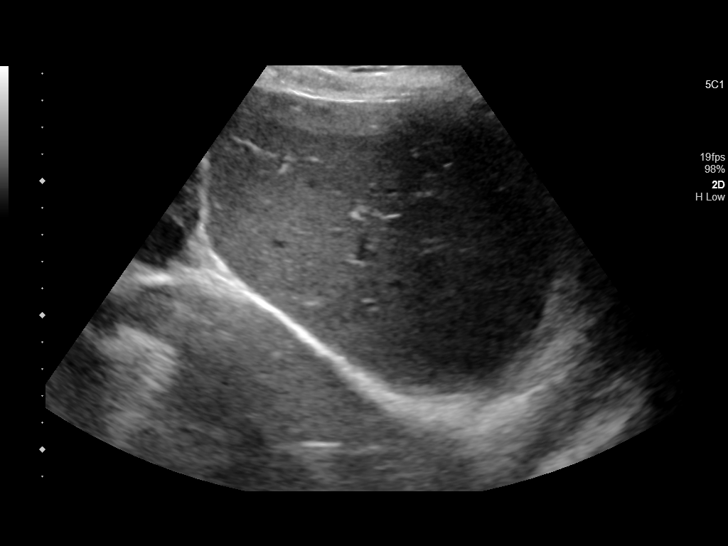
[im 39/63]
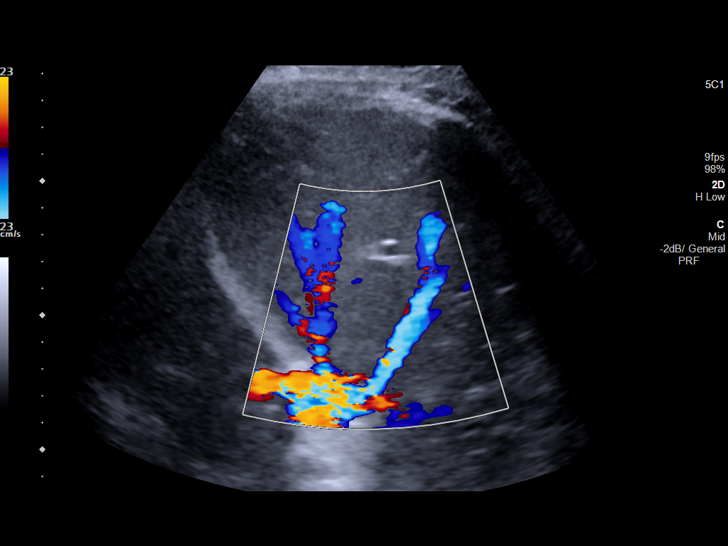
[im 42/63]
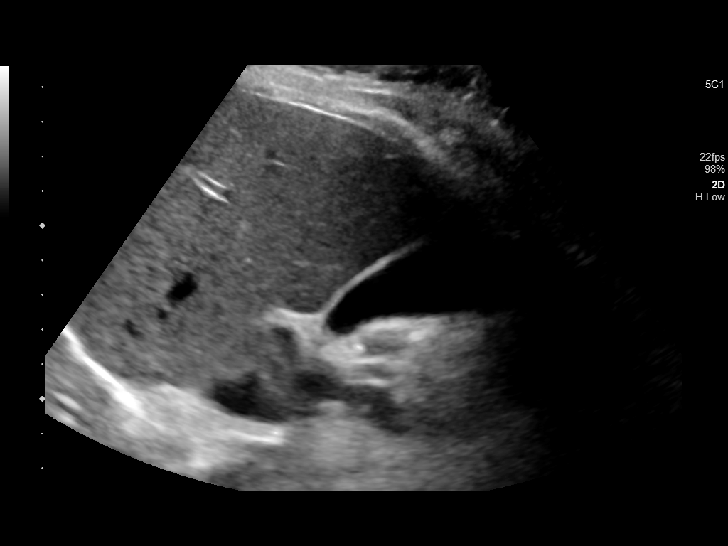
[im 47/63]
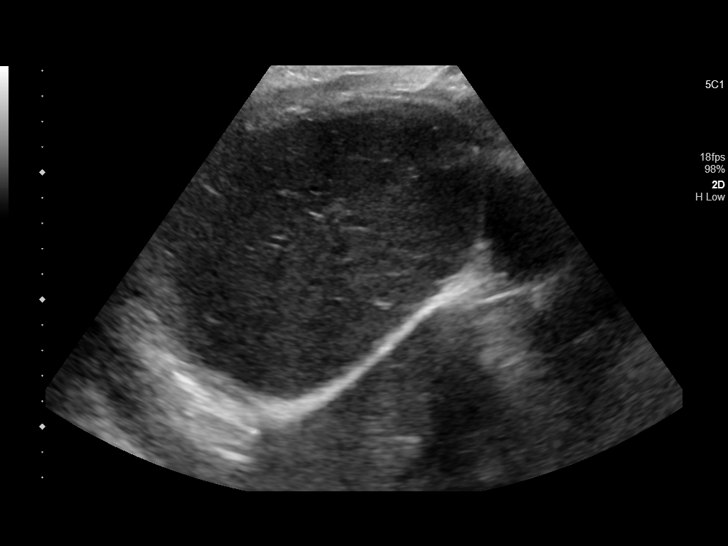
[im 52/63]
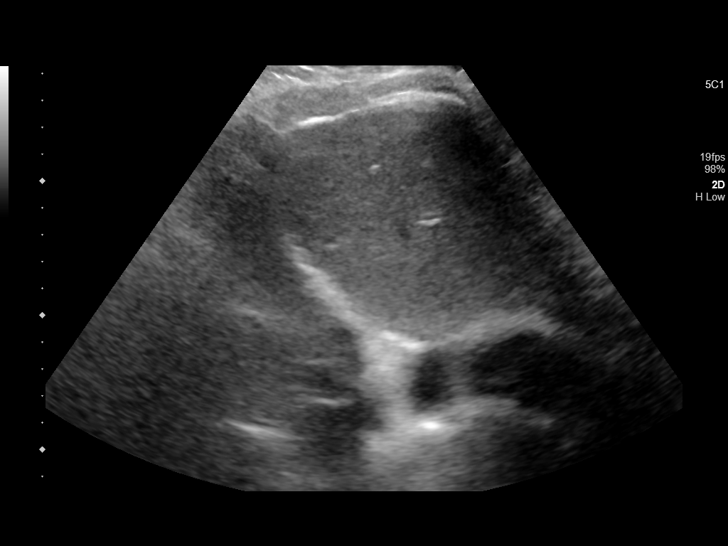
[im 57/63]
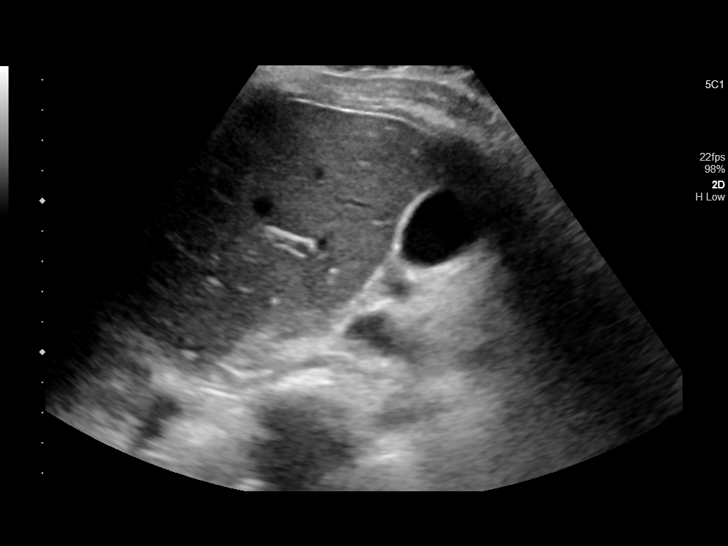
[im 63/63]
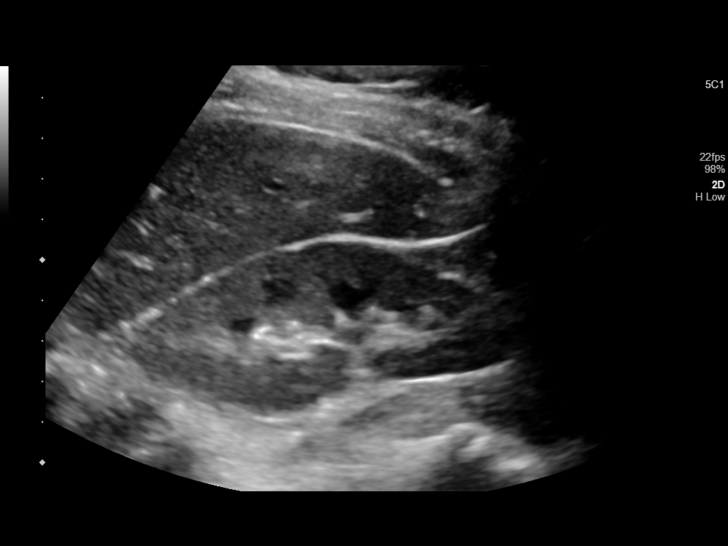

[14 of 25 positions shown; findings below may reference images not displayed]

FINDINGS: Gallbladder:

No gallstones or wall thickening visualized. No sonographic Murphy
sign noted by sonographer.

Common bile duct:

Diameter: 4 mm, normal.

Liver:

No focal lesion identified. Within normal limits in parenchymal
echogenicity. Portal vein is patent on color Doppler imaging with
normal direction of blood flow towards the liver.

Other: None.
IMPRESSION: Normal.

## 2022-06-12 ENCOUNTER — Emergency Department
Admission: EM | Admit: 2022-06-12 | Discharge: 2022-06-12 | Disposition: A | Discharge status: Home / Self Care | Payer: BC Managed Care – PPO | Attending: Emergency Medicine | Admitting: Emergency Medicine

## 2022-06-12 ENCOUNTER — Other Ambulatory Visit: Payer: Self-pay

## 2022-06-12 ENCOUNTER — Encounter: Payer: Self-pay | Admitting: Emergency Medicine

## 2022-06-12 DIAGNOSIS — Z0289 Encounter for other administrative examinations: Secondary | ICD-10-CM | POA: Diagnosis present

## 2022-06-14 ENCOUNTER — Emergency Department
Admission: EM | Admit: 2022-06-14 | Discharge: 2022-06-14 | Disposition: A | Discharge status: Home / Self Care | Payer: BC Managed Care – PPO | Attending: Emergency Medicine | Admitting: Emergency Medicine

## 2022-06-14 ENCOUNTER — Encounter: Payer: Self-pay | Admitting: Emergency Medicine

## 2022-06-14 ENCOUNTER — Other Ambulatory Visit: Payer: Self-pay

## 2022-06-14 DIAGNOSIS — Z7689 Persons encountering health services in other specified circumstances: Secondary | ICD-10-CM | POA: Insufficient documentation

## 2022-06-14 NOTE — ED Provider Notes (Signed)
[facility] Provider Note  Patient Contact: 4:52 PM (approximate)   History   No chief complaint on file.   HPI  Erika Higgins is a 26 y.o. female presents to the emergency department for a return to work evaluation.  Patient was seen and evaluated here on 625.  Patient states that she has had some nausea and vomiting which is kept her from feeling well enough to go to work.  She is requesting to return to work on Thursday.      Physical Exam   Triage Vital Signs: ED Triage Vitals  Enc Vitals Group     BP 06/14/22 1630 124/73     Pulse Rate 06/14/22 1630 92     Resp 06/14/22 1630 16     Temp 06/14/22 1630 99.3 F (37.4 C)     Temp Source 06/14/22 1630 Oral     SpO2 06/14/22 1630 99 %     Weight 06/14/22 1621 120 lb 13 oz (54.8 kg)     Height 06/14/22 1621 5\' 2"  (1.575 m)     Head Circumference --      Peak Flow --      Pain Score 06/14/22 1621 0     Pain Loc --      Pain Edu? --      Excl. in GC? --     Most recent vital signs: Vitals:   06/14/22 1630  BP: 124/73  Pulse: 92  Resp: 16  Temp: 99.3 F (37.4 C)  SpO2: 99%     General: Alert and in no acute distress. Eyes:  PERRL. EOMI. Head: No acute traumatic findings ENT:      Nose: No congestion/rhinnorhea.      Mouth/Throat: Mucous membranes are moist. Neck: No stridor. No cervical spine tenderness to palpation. Cardiovascular:  Good peripheral perfusion Respiratory: Normal respiratory effort without tachypnea or retractions. Lungs CTAB. Good air entry to the bases with no decreased or absent breath sounds. Gastrointestinal: Bowel sounds 4 quadrants. Soft and nontender to palpation. No guarding or rigidity. No palpable masses. No distention. No CVA tenderness. Musculoskeletal: Full range of motion to all extremities.  Neurologic:  No gross focal neurologic deficits are appreciated.  Skin:   No rash noted    ED Results / Procedures / Treatments   Labs (all labs ordered  are listed, but only abnormal results are displayed) Labs Reviewed - No data to display      PROCEDURES:  Critical Care performed: No  Procedures   MEDICATIONS ORDERED IN ED: Medications - No data to display   IMPRESSION / MDM / ASSESSMENT AND PLAN / ED COURSE  I reviewed the triage vital signs and the nursing notes.                              Assessment and plan:  Return to work evaluation:  Patient presents to the emergency department for a return to work evaluation.  Patient was given a work note to return to work on Thursday per her request.      FINAL CLINICAL IMPRESSION(S) / ED DIAGNOSES   Final diagnoses:  Return to work evaluation     Rx / DC Orders   ED Discharge Orders     None        Note:  This document was prepared using Dragon voice recognition software and may include unintentional dictation errors.   Orvil Feil, PA-C  06/14/22 1655    Merwyn Katos, MD 06/14/22 2244

## 2022-06-17 ENCOUNTER — Emergency Department: Payer: BC Managed Care – PPO

## 2022-06-17 ENCOUNTER — Emergency Department
Admission: EM | Admit: 2022-06-17 | Discharge: 2022-06-17 | Discharge status: Against Medical Advice | Payer: BC Managed Care – PPO | Attending: Emergency Medicine | Admitting: Emergency Medicine

## 2022-06-17 ENCOUNTER — Other Ambulatory Visit: Payer: Self-pay

## 2022-06-17 DIAGNOSIS — D72829 Elevated white blood cell count, unspecified: Secondary | ICD-10-CM | POA: Insufficient documentation

## 2022-06-17 DIAGNOSIS — R0781 Pleurodynia: Secondary | ICD-10-CM | POA: Diagnosis present

## 2022-06-17 DIAGNOSIS — R1084 Generalized abdominal pain: Secondary | ICD-10-CM | POA: Diagnosis not present

## 2022-06-17 DIAGNOSIS — R0782 Intercostal pain: Secondary | ICD-10-CM

## 2022-06-17 LAB — CBC WITH DIFFERENTIAL/PLATELET
Abs Immature Granulocytes: 0.07 10*3/uL (ref 0.00–0.07)
Basophils Absolute: 0 10*3/uL (ref 0.0–0.1)
Basophils Relative: 0 %
Eosinophils Absolute: 0 10*3/uL (ref 0.0–0.5)
Eosinophils Relative: 0 %
HCT: 45.3 % (ref 36.0–46.0)
Hemoglobin: 15 g/dL (ref 12.0–15.0)
Immature Granulocytes: 0 %
Lymphocytes Relative: 3 %
Lymphs Abs: 0.4 10*3/uL — ABNORMAL LOW (ref 0.7–4.0)
MCH: 30.3 pg (ref 26.0–34.0)
MCHC: 33.1 g/dL (ref 30.0–36.0)
MCV: 91.5 fL (ref 80.0–100.0)
Monocytes Absolute: 0.2 10*3/uL (ref 0.1–1.0)
Monocytes Relative: 2 %
Neutro Abs: 15.6 10*3/uL — ABNORMAL HIGH (ref 1.7–7.7)
Neutrophils Relative %: 95 %
Platelets: 266 10*3/uL (ref 150–400)
RBC: 4.95 MIL/uL (ref 3.87–5.11)
RDW: 12.5 % (ref 11.5–15.5)
WBC: 16.4 10*3/uL — ABNORMAL HIGH (ref 4.0–10.5)
nRBC: 0 % (ref 0.0–0.2)

## 2022-06-17 LAB — COMPREHENSIVE METABOLIC PANEL
ALT: 15 U/L (ref 0–44)
AST: 21 U/L (ref 15–41)
Albumin: 5.4 g/dL — ABNORMAL HIGH (ref 3.5–5.0)
Alkaline Phosphatase: 59 U/L (ref 38–126)
Anion gap: 11 (ref 5–15)
BUN: 17 mg/dL (ref 6–20)
CO2: 19 mmol/L — ABNORMAL LOW (ref 22–32)
Calcium: 9.9 mg/dL (ref 8.9–10.3)
Chloride: 107 mmol/L (ref 98–111)
Creatinine, Ser: 0.78 mg/dL (ref 0.44–1.00)
GFR, Estimated: >60 mL/min (ref >60)
Glucose, Bld: 151 mg/dL — ABNORMAL HIGH (ref 70–99)
Potassium: 3.7 mmol/L (ref 3.5–5.1)
Sodium: 137 mmol/L (ref 135–145)
Total Bilirubin: 1.6 mg/dL — ABNORMAL HIGH (ref 0.3–1.2)
Total Protein: 8.2 g/dL — ABNORMAL HIGH (ref 6.5–8.1)

## 2022-06-17 LAB — LIPASE, BLOOD: Lipase: 21 U/L (ref 11–51)

## 2022-06-17 LAB — TROPONIN I (HIGH SENSITIVITY): Troponin I (High Sensitivity): 4 ng/L (ref <18)

## 2022-06-17 MED ORDER — ONDANSETRON 4 MG PO TBDP
4.0000 mg | ORAL_TABLET | Freq: Once | ORAL | Status: AC
  Administered 2022-06-17: 4 mg via ORAL
  Filled 2022-06-17: qty 1

## 2022-06-17 MED ORDER — DROPERIDOL 2.5 MG/ML IJ SOLN
2.5000 mg | Freq: Once | INTRAMUSCULAR | Status: AC
  Administered 2022-06-17: 2.5 mg via INTRAMUSCULAR
  Filled 2022-06-17: qty 2

## 2022-06-17 MED ORDER — SODIUM CHLORIDE 0.9 % IV BOLUS: 1000.0000 mL | Freq: Once | INTRAVENOUS | Status: DC

## 2022-06-17 MED ORDER — OXYCODONE HCL 5 MG PO TABS: 5.0000 mg | ORAL_TABLET | Freq: Once | ORAL | Status: DC

## 2022-06-17 NOTE — ED Notes (Signed)
Pt states she has textural issues and cannot take pills

## 2022-06-17 NOTE — ED Triage Notes (Signed)
Pt hanging over chair in pain, crying out, states she can't breath. Pt is AOX4. Pt states she has recurring costochondritis since childhood and it is always this severe. Pt states dehydration exacerbates s/s. Pt reports pain in ribs and "right at my heart."

## 2022-06-17 NOTE — ED Notes (Signed)
Pt refusing to provide urine for UAPOC, pt stating she wants to go home and she feels better, provider made aware via secure messaging

## 2022-06-17 NOTE — ED Notes (Signed)
PT signed AMA form and verbally stated they understand the risks with leaving AMA. Pt provided information about returning to the ER for worsening symptoms. Pt ambulatory off unit on foot

## 2022-06-17 NOTE — ED Notes (Signed)
Called lab, pt insists blood was already sent in traige. Lab running blood now.

## 2022-06-17 NOTE — ED Provider Triage Note (Cosign Needed)
Emergency Medicine Provider Triage Evaluation Note  Erika Higgins , a 26 y.o. female  was evaluated in triage.  Pt complains of chest wall pain secondary to chronic costochondritis. No change in severity or location, but they were unable to manage pain at home.  Physical Exam  BP (!) 135/92 (BP Location: Left Arm)   Pulse 89   Temp 99.9 F (37.7 C) (Oral)   Resp 18   SpO2 98%  Gen:   Awake, no distress   Resp:  Normal effort  MSK:   Moves extremities without difficulty  Other:    Medical Decision Making  Medically screening exam initiated at 3:27 PM.  Appropriate orders placed.  Erika Higgins was informed that the remainder of the evaluation will be completed by another provider, this initial triage assessment does not replace that evaluation, and the importance of remaining in the ED until their evaluation is complete.    Chinita Pester, FNP 06/17/22 1529

## 2022-06-17 NOTE — ED Provider Notes (Signed)
[facility] Emergency Department Provider Note     Event Date/Time   First MD Initiated Contact with Patient 06/17/22 1728     (approximate)   History   Chest Pain   HPI  Erika Higgins is a 26 y.o. female with ADHD, anxiety, depression, and GERD, presents to the ED with reports of acute exacerbation of her chronic persistent costochondritis.  Patient reports her symptoms are exacerbated by dehydration.  She reports pain in her ribs and pain at her heart.  She denies any recent injury, trauma, cough, or congestion.     Physical Exam   Triage Vital Signs: ED Triage Vitals  Enc Vitals Group     BP 06/17/22 1447 (!) 135/92     Pulse Rate 06/17/22 1447 89     Resp 06/17/22 1447 18     Temp 06/17/22 1447 99.9 F (37.7 C)     Temp Source 06/17/22 1447 Oral     SpO2 06/17/22 1447 98 %     Weight --      Height --      Head Circumference --      Peak Flow --      Pain Score 06/17/22 1524 10     Pain Loc --      Pain Edu? --      Excl. in GC? --     Most recent vital signs: Vitals:   06/17/22 1447  BP: (!) 135/92  Pulse: 89  Resp: 18  Temp: 99.9 F (37.7 C)  SpO2: 98%    General Awake, no distress. NAD. Sleeping up interim exam HEENT NCAT. PERRL. EOMI. No rhinorrhea. Mucous membranes are moist.  CV:  Good peripheral perfusion.  RESP:  Normal effort.  ABD:  No distention. Soft, nontender. Mild left CVA tenderness   ED Results / Procedures / Treatments   Labs (all labs ordered are listed, but only abnormal results are displayed) Labs Reviewed  CBC WITH DIFFERENTIAL/PLATELET - Abnormal; Notable for the following components:      Result Value   WBC 16.4 (*)    Neutro Abs 15.6 (*)    Lymphs Abs 0.4 (*)    All other components within normal limits  COMPREHENSIVE METABOLIC PANEL - Abnormal; Notable for the following components:   CO2 19 (*)    Glucose, Bld 151 (*)    Total Protein 8.2 (*)    Albumin 5.4 (*)    Total Bilirubin 1.6  (*)    All other components within normal limits  LIPASE, BLOOD  POC URINE PREG, ED  TROPONIN I (HIGH SENSITIVITY)  TROPONIN I (HIGH SENSITIVITY)     EKG  Vent. rate 102 BPM PR interval 148 ms QRS duration 74 ms QT/QTcB 334/435 ms P-R-T axes 71 93 39 No STEMI  RADIOLOGY  I personally viewed and evaluated these images as part of my medical decision making, as well as reviewing the written report by the radiologist.  ED Provider Interpretation: no acute findings}  DG Chest 2 View  Result Date: 06/17/2022 CLINICAL DATA:  Chest pain EXAM: CHEST - 2 VIEW COMPARISON:  08/10/2019 FINDINGS: The heart size and mediastinal contours are within normal limits. Both lungs are clear. The visualized skeletal structures are unremarkable. IMPRESSION: No active cardiopulmonary disease. Electronically Signed   By: Jasmine Pang M.D.   On: 06/17/2022 17:41     PROCEDURES:  Critical Care performed: No  Procedures   MEDICATIONS ORDERED IN ED: Medications  oxyCODONE (Oxy IR/ROXICODONE)  immediate release tablet 5 mg (5 mg Oral Patient Refused/Not Given 06/17/22 1638)  sodium chloride 0.9 % bolus 1,000 mL (has no administration in time range)  ondansetron (ZOFRAN-ODT) disintegrating tablet 4 mg (4 mg Oral Given 06/17/22 1531)  droperidol (INAPSINE) 2.5 MG/ML injection 2.5 mg (2.5 mg Intramuscular Given 06/17/22 1638)     IMPRESSION / MDM / ASSESSMENT AND PLAN / ED COURSE  I reviewed the triage vital signs and the nursing notes.                              Differential diagnosis includes, but is not limited to, ACS, aortic dissection, pulmonary embolism, cardiac tamponade, pneumothorax, pneumonia, pericarditis, myocarditis, GI-related causes including esophagitis/gastritis, and musculoskeletal chest wall pain.    Patient's presentation is most consistent with acute complicated illness / injury requiring diagnostic workup.  Patient's diagnosis is consistent with complaints of chest pain and  abdominal pain. Patient declined further work-up or interventions. She requested discontinuation of her IV and refused treatment. Patient will be discharged AMA. Patient is to follow up with her PCP as needed or otherwise directed. Patient is given ED precautions to return to the ED for any worsening or new symptoms.  06/17/2022 at 7:19 PM:  The patient requested to leave.  I considered this to be leaving against medical advice. I personally discussed the following with them:   That they currently had a medical condition of chest pain, abdominal pain, and leukocytosis and I am concerned that they may have infection/sepsis, acute abdominal process, and acute cardiothoracic process.     My proposed course of evaluation and treatment includes, but is not limited to, IV fluids/antibiotics, CT imaging.  Benefits of staying include possible diagnosis or excluding of lung infection or an alternative serious condition such as pyelonephritis or appendicitis, which if identified early would lead to appropriate intervention in a timely manner lessening the burden of disability and death.  Risks of leaving before this had been completed include: misdiagnosis, worsening illness leading up to and including prolonged or permanent disability or death.  Specific risks pertinent, but not all inclusive, of their current medical condition include but are not limited to kidney failure, respiratory failure.  I also discussed alternatives including sepsis.  Despite this they stated they wanted to leave due to anxiety related to hospitals and refused further evaluation, treatment, or admission at this time.   They appeared clinically sober, were mentating appropriately, were free from distracting injury, had adequately controlled acute pain, appeared to have intact insight, judgment, and reason, and in my opinion had the capacity to make this decision.  Specifically, they were able to verbally state back in a coherent  manner their current medical condition/current diagnosis, the proposed course of evaluation and/or treatment, and the risks, benefits, and alternatives of treatment versus leaving against medical advice.   They understand that they may return to seek medical attention here at ANY time they want.  I strongly advised them to return to the Emergency Department immediately if they experience any new or worsening symptoms that concern them, or simply if they reconsider continued evaluation and/or treatment as previously discussed.  This would be without any repercussions, though they understand they likely will need to wait again in the Emergency Department if other patients are in front of them, rather than being brought straight back.  They understood this is another advantage of staying, but still insisted upon leaving.  I recommended  they follow-up with any ED at the earliest available opportunity/appointment for further evaluation and treatment.   The patient was discharged against medical advice.  They did not accept written discharge instructions.   FINAL CLINICAL IMPRESSION(S) / ED DIAGNOSES   Final diagnoses:  Intercostal pain  Generalized abdominal pain  Leukocytosis, unspecified type     Rx / DC Orders   ED Discharge Orders     None        Note:  This document was prepared using Dragon voice recognition software and may include unintentional dictation errors.    Lissa Hoard, PA-C 06/17/22 1927    Gilles Chiquito, MD 06/17/22 [phone]

## 2022-06-17 NOTE — ED Notes (Signed)
Pt had episode of vomiting at this time

## 2022-06-18 ENCOUNTER — Other Ambulatory Visit: Payer: Self-pay

## 2022-06-18 ENCOUNTER — Emergency Department
Admission: EM | Admit: 2022-06-18 | Discharge: 2022-06-18 | Disposition: A | Discharge status: Home / Self Care | Payer: BC Managed Care – PPO | Attending: Emergency Medicine | Admitting: Emergency Medicine

## 2022-06-18 ENCOUNTER — Encounter: Payer: Self-pay | Admitting: Emergency Medicine

## 2022-06-18 DIAGNOSIS — M940 Chondrocostal junction syndrome [Tietze]: Secondary | ICD-10-CM | POA: Diagnosis not present

## 2022-06-18 DIAGNOSIS — R112 Nausea with vomiting, unspecified: Secondary | ICD-10-CM | POA: Insufficient documentation

## 2022-06-18 DIAGNOSIS — F12288 Cannabis dependence with other cannabis-induced disorder: Secondary | ICD-10-CM | POA: Insufficient documentation

## 2022-06-18 DIAGNOSIS — R0789 Other chest pain: Secondary | ICD-10-CM | POA: Diagnosis present

## 2022-06-18 MED ORDER — DROPERIDOL 2.5 MG/ML IJ SOLN
2.5000 mg | Freq: Once | INTRAMUSCULAR | Status: AC | PRN
  Administered 2022-06-18: 2.5 mg via INTRAMUSCULAR
  Filled 2022-06-18: qty 2

## 2022-06-18 MED ORDER — ONDANSETRON 8 MG PO TBDP: 8.0000 mg | ORAL_TABLET | Freq: Three times a day (TID) | ORAL | 0 refills | Status: DC | PRN

## 2022-06-18 MED ORDER — KETOROLAC TROMETHAMINE 60 MG/2ML IM SOLN
60.0000 mg | Freq: Once | INTRAMUSCULAR | Status: AC | PRN
  Administered 2022-06-18: 60 mg via INTRAMUSCULAR
  Filled 2022-06-18: qty 2

## 2022-06-18 NOTE — ED Notes (Signed)
Pt in bed, sig other at bedside, read and reviewed d/c instructions with pt.  Pt verbalized understanding, pt requests odt zofran, md notified, explained to pt that she had already gotten a nausea medication that has helped with her nausea and will need to get her prescription filled.  Pt from dpt via wc with sig other.

## 2022-06-18 NOTE — ED Notes (Signed)
Pt in bed, pt c/o chest pain, states that she was here yesterday and dx with costochondritis,  pt is sticking her finger down her throat and dry heaving.  Explained to pt that the self induced dry heaves may be making her pain worse because it could increase the movement in her chest, pt verbalized understanding, but states that she feels like there are bubbles in her stomach that need to come out.  Sig other at bedside.  Pt states that her fingers are tingly, encouraged slower breathing to help with the tingly feeling.  Pt states that they gave her a shot yesterday and this helped.

## 2022-06-18 NOTE — ED Triage Notes (Signed)
Pt reports chest pain and nausea for the last week. Pt reports has had intermittent episodes of costochondritis her entire life and this is it flaring up again. Pt reports was here last night but left.

## 2022-06-18 NOTE — Discharge Instructions (Addendum)
Please use ibuprofen (Motrin) up to 800 mg every 8 hours, naproxen (Naprosyn) up to 500 mg every 12 hours, and/or acetaminophen (Tylenol) up to 4 g/day for any continued pain Please schedule the Zofran every 8 hours for the next 48 hours while you rehydrate with a one-to-one mixture of water and electrolyte solution (Gatorade, gatorlyte, Pedialyte, body armor, etc.)

## 2022-06-18 NOTE — ED Notes (Signed)
Pt in bed, sig other at bedside, pt reports continued sob and sternal pain, pt on O2 sat monitor satting 100%, pt no longer fidgeting

## 2022-06-18 NOTE — ED Notes (Signed)
Meds given after confirming IM sites with pharmacy .

## 2022-06-18 NOTE — ED Provider Notes (Signed)
[facility] Provider Note   Event Date/Time   First MD Initiated Contact with Patient 06/18/22 [phone]     (approximate) History  Chest Pain and Nausea  HPI Erika Higgins is a 26 y.o. female  Location: Central chest Duration: 3 days prior to arrival Timing: Stable since onset Severity: 8/10 Quality: Burning/aching pain Context: Patient states she has a history of of chronic costochondritis as well as recurrent nausea and vomiting and states that this has "flared" over the last 3 days Modifying factors: Movement and vomiting worsens her chest pain and she denies any relieving factors Associated Symptoms: Nausea/vomiting ROS: Patient currently denies any vision changes, tinnitus, difficulty speaking, facial droop, sore throat, shortness of breath, abdominal pain, diarrhea, dysuria, or weakness/numbness/paresthesias in any extremity   Physical Exam  Triage Vital Signs: ED Triage Vitals  Enc Vitals Group     BP 06/18/22 0815 (!) 165/99     Pulse Rate 06/18/22 0815 96     Resp 06/18/22 0815 20     Temp 06/18/22 0815 98.6 F (37 C)     Temp Source 06/18/22 0815 Oral     SpO2 06/18/22 0815 100 %     Weight 06/18/22 0811 120 lb (54.4 kg)     Height 06/18/22 0811 5\' 2"  (1.575 m)     Head Circumference --      Peak Flow --      Pain Score 06/18/22 0811 8     Pain Loc --      Pain Edu? --      Excl. in GC? --    Most recent vital signs: Vitals:   06/18/22 0815 06/18/22 0950  BP: (!) 165/99 (!) 157/99  Pulse: 96 77  Resp: 20 18  Temp: 98.6 F (37 C) 98.3 F (36.8 C)  SpO2: 100%    General: Awake, oriented x4. CV:  Good peripheral perfusion.  Resp:  Normal effort.  Abd:  No distention.  Other:  Young adult Caucasian female asleep in bed but easily arousable to voice.  Tenderness to palpation over sternum ED Results / Procedures / Treatments  Labs (all labs ordered are listed, but only abnormal results are displayed) Labs Reviewed - No data to  display EKG ED ECG REPORT I, 08/19/22, the attending physician, personally viewed and interpreted this ECG. Date: 06/17/2022 EKG Time: 1527 Rate: 102 Rhythm: Tachycardic sinus rhythm QRS Axis: normal Intervals: normal ST/T Wave abnormalities: normal Narrative Interpretation: Tachycardic sinus rhythm.  No evidence of acute ischemia RADIOLOGY ED MD interpretation: 2 view chest x-ray interpreted by me shows no evidence of acute abnormalities including no pneumonia, pneumothorax, or widened mediastinum -Agree with radiology assessment Official radiology report(s): DG Chest 2 View  Result Date: 06/17/2022 CLINICAL DATA:  Chest pain EXAM: CHEST - 2 VIEW COMPARISON:  08/10/2019 FINDINGS: The heart size and mediastinal contours are within normal limits. Both lungs are clear. The visualized skeletal structures are unremarkable. IMPRESSION: No active cardiopulmonary disease. Electronically Signed   By: 08/12/2019 M.D.   On: 06/17/2022 17:41   PROCEDURES: Critical Care performed: No Procedures MEDICATIONS ORDERED IN ED: Medications  droperidol (INAPSINE) 2.5 MG/ML injection 2.5 mg (2.5 mg Intramuscular Given 06/18/22 0834)  ketorolac (TORADOL) injection 60 mg (60 mg Intramuscular Given 06/18/22 0831)   IMPRESSION / MDM / ASSESSMENT AND PLAN / ED COURSE  I reviewed the triage vital signs and the nursing notes.  The patient is on the cardiac monitor to evaluate for evidence of arrhythmia and/or significant heart rate changes. Patient's presentation is most consistent with acute presentation with potential threat to life or bodily function. This patient presents with atypical chest pain, most likely secondary to musculoskeletal injury. Differential diagnosis includes rib fracture, costochondritis, sternal fracture. Low suspicion for ACS, acute PE (PERC negative), pericarditis / myocarditis, thoracic aortic dissection, pneumothorax, pneumonia or other acute  infectious process. Presentation not consistent with other acute, emergent causes of chest pain at this time. No indication for cardiac enzyme testing. Plan to order CXR to evaluate for acute cardiopulmonary causes.  Plan: EKG, CXR, pain control Reassessment: 1016 patient asleep in bed with pain obviously well controlled.  P.o. tolerant.  Discussed with patient the importance of rehydration as well as scheduling medics, NSAIDs and Tylenol for continued pain/nausea Dispo: Discharge home with home care   FINAL CLINICAL IMPRESSION(S) / ED DIAGNOSES   Final diagnoses:  Chest wall pain  Costochondritis  Cannabinoid hyperemesis syndrome   Rx / DC Orders   ED Discharge Orders          Ordered    ondansetron (ZOFRAN-ODT) 8 MG disintegrating tablet  Every 8 hours PRN        06/18/22 1014           Note:  This document was prepared using Dragon voice recognition software and may include unintentional dictation errors.   Merwyn Katos, MD 06/18/22 1020

## 2022-06-19 ENCOUNTER — Other Ambulatory Visit: Payer: Self-pay

## 2022-06-19 ENCOUNTER — Emergency Department
Admission: EM | Admit: 2022-06-19 | Discharge: 2022-06-19 | Disposition: A | Discharge status: Home / Self Care | Payer: BC Managed Care – PPO | Attending: Emergency Medicine | Admitting: Emergency Medicine

## 2022-06-19 DIAGNOSIS — F12188 Cannabis abuse with other cannabis-induced disorder: Secondary | ICD-10-CM | POA: Insufficient documentation

## 2022-06-19 DIAGNOSIS — R079 Chest pain, unspecified: Secondary | ICD-10-CM | POA: Diagnosis present

## 2022-06-19 DIAGNOSIS — R112 Nausea with vomiting, unspecified: Secondary | ICD-10-CM

## 2022-06-19 LAB — COMPREHENSIVE METABOLIC PANEL
ALT: 27 U/L (ref 0–44)
AST: 41 U/L (ref 15–41)
Albumin: 5.3 g/dL — ABNORMAL HIGH (ref 3.5–5.0)
Alkaline Phosphatase: 59 U/L (ref 38–126)
Anion gap: 11 (ref 5–15)
BUN: 23 mg/dL — ABNORMAL HIGH (ref 6–20)
CO2: 19 mmol/L — ABNORMAL LOW (ref 22–32)
Calcium: 10.1 mg/dL (ref 8.9–10.3)
Chloride: 107 mmol/L (ref 98–111)
Creatinine, Ser: 0.87 mg/dL (ref 0.44–1.00)
GFR, Estimated: >60 mL/min (ref >60)
Glucose, Bld: 114 mg/dL — ABNORMAL HIGH (ref 70–99)
Potassium: 3.6 mmol/L (ref 3.5–5.1)
Sodium: 137 mmol/L (ref 135–145)
Total Bilirubin: 2.4 mg/dL — ABNORMAL HIGH (ref 0.3–1.2)
Total Protein: 8.5 g/dL — ABNORMAL HIGH (ref 6.5–8.1)

## 2022-06-19 LAB — LIPASE, BLOOD: Lipase: 23 U/L (ref 11–51)

## 2022-06-19 LAB — TROPONIN I (HIGH SENSITIVITY): Troponin I (High Sensitivity): <2 ng/L (ref <18)

## 2022-06-19 MED ORDER — DROPERIDOL 2.5 MG/ML IJ SOLN
2.5000 mg | Freq: Once | INTRAMUSCULAR | Status: AC
  Administered 2022-06-19: 2.5 mg via INTRAVENOUS
  Filled 2022-06-19: qty 2

## 2022-06-19 MED ORDER — KETOROLAC TROMETHAMINE 30 MG/ML IJ SOLN
15.0000 mg | Freq: Once | INTRAMUSCULAR | Status: AC
  Administered 2022-06-19: 15 mg via INTRAVENOUS
  Filled 2022-06-19: qty 1

## 2022-06-19 MED ORDER — DIPHENHYDRAMINE HCL 50 MG/ML IJ SOLN
12.5000 mg | INTRAMUSCULAR | Status: DC
  Filled 2022-06-19: qty 1

## 2022-06-19 MED ORDER — LACTATED RINGERS IV BOLUS
1000.0000 mL | Freq: Once | INTRAVENOUS | Status: AC
  Administered 2022-06-19: 1000 mL via INTRAVENOUS

## 2022-06-19 MED ORDER — DIPHENHYDRAMINE HCL 50 MG/ML IJ SOLN
25.0000 mg | INTRAMUSCULAR | Status: AC
  Administered 2022-06-19: 25 mg via INTRAVENOUS

## 2022-06-19 NOTE — ED Notes (Signed)
Pt is running in place beside the bed. Pt has been asked to remain in the bed and rail has been put in place. Pt is asked to do some breathing exercises while medication takes affect. Pt's partner at bedside and attempting to spoon liquid into pt's mouth. Pt has stopped yelling and making vomiting noises at this time.

## 2022-06-19 NOTE — ED Notes (Signed)
Pt in bed, pt continues to report 9/10 rib cage pain, pt states that she is ready to go home, sig other at bedside, pt verbalized understanding d/c and follow up, resps even and unlabored, pt from dpt.

## 2022-06-19 NOTE — ED Provider Notes (Signed)
[facility] Provider Note    Event Date/Time   First MD Initiated Contact with Patient 06/19/22 [phone]     (approximate)   History   Chest Pain   HPI Level 5 caveat:  history/ROS limited by acute/critical illness  Erika Higgins is a 26 y.o. female with 4 visits in the emergency department over the last several days.  She presents tonight by private vehicle screaming, yelling "ouch ouch ouch it hurts it hurts", and retching without producing any emesis.  She states that she feels like she is being stabbed all over her chest.  She is hyperventilating and bicycling her legs and will not stop moving.  She said this is similar to her prior presentations.  She states that she has been dealing with this "forever".  She has been told in the past that she has cyclic vomiting syndrome.  She also admits to "regular" marijuana use.  She has also been told in the past that she has "chronic costochondritis".     Physical Exam   Triage Vital Signs: ED Triage Vitals  Enc Vitals Group     BP 06/19/22 0535 (!) 136/103     Pulse Rate 06/19/22 0535 (!) 110     Resp 06/19/22 0535 (!) 28     Temp 06/19/22 0535 98 F (36.7 C)     Temp Source 06/19/22 0535 Oral     SpO2 06/19/22 0535 100 %     Weight --      Height --      Head Circumference --      Peak Flow --      Pain Score 06/19/22 0536 10     Pain Loc --      Pain Edu? --      Excl. in GC? --     Most recent vital signs: Vitals:   06/19/22 0630 06/19/22 0645  BP: (!) 151/94 (!) 140/96  Pulse: 87 86  Resp: 18   Temp:    SpO2: 100% 100%     General: Awake, yelling and screaming as described above, obviously anxious. CV:  Good peripheral perfusion.  Mild tachycardia initially, settled down to the 80s even while she was still yelling and screaming.  Normal heart sounds. Resp:  Normal effort but with tachypnea.  Lungs are clear to auscultation bilaterally. Abd:  Thin body habitus.  No distention.  No  tenderness to palpation. Other:  Extremely anxious, hyperventilating and presenting similar to a panic attack.   ED Results / Procedures / Treatments   Labs (all labs ordered are listed, but only abnormal results are displayed) Labs Reviewed  COMPREHENSIVE METABOLIC PANEL - Abnormal; Notable for the following components:      Result Value   CO2 19 (*)    Glucose, Bld 114 (*)    BUN 23 (*)    Total Protein 8.5 (*)    Albumin 5.3 (*)    Total Bilirubin 2.4 (*)    All other components within normal limits  LIPASE, BLOOD  TROPONIN I (HIGH SENSITIVITY)     PROCEDURES:  Critical Care performed: No  Procedures   MEDICATIONS ORDERED IN ED: Medications  ketorolac (TORADOL) 30 MG/ML injection 15 mg (has no administration in time range)  lactated ringers bolus 1,000 mL (1,000 mLs Intravenous New Bag/Given 06/19/22 0544)  droperidol (INAPSINE) 2.5 MG/ML injection 2.5 mg (2.5 mg Intravenous Given 06/19/22 0548)  diphenhydrAMINE (BENADRYL) injection 25 mg (25 mg Intravenous Given 06/19/22 0545)  IMPRESSION / MDM / ASSESSMENT AND PLAN / ED COURSE  I reviewed the triage vital signs and the nursing notes.                              Differential diagnosis includes, but is not limited to, cannabinoid hyperemesis syndrome, cyclic vomiting syndrome, gastroparesis, electrolyte or metabolic abnormality, much less likely pneumothorax or ACS or PE.  Panic attack is also very likely.  Patient's presentation is most consistent with acute presentation with potential threat to life or bodily function.  Although her presentation strongly suggests cannabinoid hyperemesis syndrome versus cyclic vomiting syndrome, with the addition of anxiety/panic attack, it is possible that if she has truly been vomiting for several days that her electrolytes are abnormal and she may be volume depleted.  I reviewed her last ED note and saw that she had great success after receiving droperidol 2.5 mg intramuscular  along with Toradol 60 mg intramuscular.  I reviewed her prior EKG and see that she had a normal QTc interval of 435 ms.  We were able to quickly establish peripheral IV so I will administer IV medication this time.  I ordered droperidol 2.5 mg IV, Benadryl 25 mg IV to help try and avoid any dystonic reactions and to help as a calming agent, and LR 1 L IV bolus.  We will keep her on the cardiac monitor to evaluate for any rate/rhythm changes.  She is also on pulse oximeter.  I ordered CMP, lipase, and CBC with differential, as well as a troponin since she is reporting chest pain, but I think this is very unlikely.  I encouraged her to try and take deep breaths and calm down all of the medication work.  I will reassess whether she needs additional calming agents.  I also had a frank and honest conversation with her that her presentation (screaming loudly, unable to control herself, etc.) very strongly suggests both cannabinoid hyperemesis syndrome and panic attacks and that she would benefit from avoiding marijuana and following up with mental health services.  Her boyfriend is at bedside and was nodding his head in agreement.  She also states that she agrees.   Clinical Course as of 06/19/22 0724  Sun Jun 19, 2022  0625 High-sensitivity troponin and lipase are both within normal limits. [CF]  3244 Comprehensive metabolic panel is notable for a total bilirubin of 2.4, otherwise unremarkable.  Looking back through her records, she has had elevated total bilirubin in the past, and I suspect this is due to either to a benign process such as Sullivan Lone syndrome or due to her persistent vomiting in the setting of cyclic vomiting versus cannabinoid hyperemesis. [CF]  [phone] Patient is now calm but no longer speaking, preferring to answer questions with thumbs up/thumbs down gestures and holding up fingers.  She is not in distress. [CF]  [phone] Patient is now calm and cooperative, resting quietly.  I checked on her and  she opened her eyes.  She is still reporting pain but is no longer thrashing around, screaming, bicycling her legs, etc.  I told her the good news about her lab results and her boyfriend agreed that she is in a much better situation than she was upon arrival.  I recommended capsaicin cream, and he said "Oh, we use that all the time at home."  I encouraged them to do so when they get home and I ordered Toradol 15 mg IV to  help with her pain now.  There is no indication that she requires admission or additional diagnostic work-up in the emergency department.  I reiterated that she needs to stop smoking marijuana and establish primary care doctor.  I gave my usual and customary return precautions. [CF]    Clinical Course User Index [CF] Loleta Rose, MD     FINAL CLINICAL IMPRESSION(S) / ED DIAGNOSES   Final diagnoses:  Cannabinoid hyperemesis syndrome     Rx / DC Orders   ED Discharge Orders     None        Note:  This document was prepared using Dragon voice recognition software and may include unintentional dictation errors.   Loleta Rose, MD 06/19/22 [phone]

## 2022-06-19 NOTE — ED Triage Notes (Signed)
Pt endorses pain in center of chest that feels "like I am being stabbed". Pt is nauseous and flailing around. Pt is yelling and saying she can't breathe.

## 2022-06-19 NOTE — ED Notes (Signed)
Pt now sitting calmly in bed. Pt provided with comfort items and lights turned low to relax.

## 2022-06-20 ENCOUNTER — Emergency Department
Admission: EM | Admit: 2022-06-20 | Discharge: 2022-06-20 | Disposition: A | Discharge status: Home / Self Care | Payer: BC Managed Care – PPO | Attending: Emergency Medicine | Admitting: Emergency Medicine

## 2022-06-20 ENCOUNTER — Other Ambulatory Visit: Payer: Self-pay

## 2022-06-20 DIAGNOSIS — M791 Myalgia, unspecified site: Secondary | ICD-10-CM | POA: Insufficient documentation

## 2022-06-20 DIAGNOSIS — G894 Chronic pain syndrome: Secondary | ICD-10-CM | POA: Diagnosis not present

## 2022-06-20 NOTE — ED Provider Notes (Signed)
[facility] Provider Note    Event Date/Time   First MD Initiated Contact with Patient 06/20/22 [phone]     (approximate)   History   No chief complaint on file.   HPI  Jeyli Zwicker is a 26 y.o. female presents to the emergency department at requesting work note be extended.  She states that her chronic costochondritis and cyclic vomiting is under control today but due to to severe muscle pain that she had endured overnight, she was unable to go back to work today.  She states that she does not feel like she will be able to go back until Thursday.  She is currently denying any medical symptoms of concern.  Past Medical History:  Diagnosis Date   GERD (gastroesophageal reflux disease)      Physical Exam   Triage Vital Signs: ED Triage Vitals  Enc Vitals Group     BP 06/20/22 0805 117/82     Pulse Rate 06/20/22 0805 84     Resp 06/20/22 0805 18     Temp 06/20/22 0805 98.1 F (36.7 C)     Temp src --      SpO2 06/20/22 0805 99 %     Weight 06/20/22 0824 119 lb 14.9 oz (54.4 kg)     Height 06/20/22 0824 5\' 2"  (1.575 m)     Head Circumference --      Peak Flow --      Pain Score 06/20/22 0802 7     Pain Loc --      Pain Edu? --      Excl. in GC? --     Most recent vital signs: Vitals:   06/20/22 0805  BP: 117/82  Pulse: 84  Resp: 18  Temp: 98.1 F (36.7 C)  SpO2: 99%    General: Awake, no distress.  CV:  Good peripheral perfusion.  Resp:  Normal effort.  Abd:  No distention.  Other:     ED Results / Procedures / Treatments   Labs (all labs ordered are listed, but only abnormal results are displayed) Labs Reviewed - No data to display   EKG  Not indicated   RADIOLOGY  Not indicated  I have independently reviewed and interpreted imaging as well as reviewed report from radiology.  PROCEDURES:  Critical Care performed: No  Procedures   MEDICATIONS ORDERED IN ED:  Medications - No data to display   IMPRESSION /  MDM / ASSESSMENT AND PLAN / ED COURSE   I reviewed the triage vital signs and the nursing notes.  Differential diagnosis includes, but is not limited to: Chronic pain syndrome, need for work excuse  Patient's presentation is most consistent with exacerbation of chronic illness.  26 year old female presenting to the emergency department requesting work excuse.  See HPI.  Patient states that she bought a tens unit yesterday which is helping with her chronic pain.  She has not had any vomiting overnight.  She also states that she has an appointment scheduled with a primary care provider for July 12 and also plans on contacting chronic pain management clinic.  She was encouraged to see primary care as scheduled.     FINAL CLINICAL IMPRESSION(S) / ED DIAGNOSES   Final diagnoses:  Chronic pain syndrome     Rx / DC Orders   ED Discharge Orders     None        Note:  This document was prepared using Dragon voice recognition software and may  include unintentional dictation errors.   Chinita Pester, FNP 06/20/22 7824    Sharman Cheek, MD 06/20/22 [phone]

## 2022-06-20 NOTE — ED Notes (Signed)
See triage note  presents with cont'd body aches  was seen yesterday with some n/v  denies nay vomiting at present

## 2022-06-20 NOTE — ED Triage Notes (Addendum)
Pt comes with some muscle pain. Pt also states she needs work note bc she doesn't feel any better to be able to go back to work. 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 Pt denies any nausea at this time.  Pt was just seen here in ED for same and dc yesterday.

## 2023-02-14 ENCOUNTER — Other Ambulatory Visit: Payer: Self-pay

## 2023-02-14 ENCOUNTER — Encounter: Payer: Self-pay | Admitting: Emergency Medicine

## 2023-02-14 ENCOUNTER — Emergency Department
Admission: EM | Admit: 2023-02-14 | Discharge: 2023-02-14 | Disposition: A | Discharge status: Home / Self Care | Payer: BC Managed Care – PPO | Attending: Emergency Medicine | Admitting: Emergency Medicine

## 2023-02-14 DIAGNOSIS — M791 Myalgia, unspecified site: Secondary | ICD-10-CM | POA: Diagnosis present

## 2023-02-14 MED ORDER — LIDOCAINE 5 % EX PTCH
1.0000 | MEDICATED_PATCH | CUTANEOUS | Status: DC
  Administered 2023-02-14: 1 via TRANSDERMAL
  Filled 2023-02-14: qty 1

## 2023-02-14 MED ORDER — KETOROLAC TROMETHAMINE 15 MG/ML IJ SOLN
15.0000 mg | Freq: Once | INTRAMUSCULAR | Status: DC
Start: 2023-02-14 — End: 2023-02-14
  Filled 2023-02-14: qty 1

## 2023-02-14 MED ORDER — NAPROXEN 500 MG PO TABS: 500.0000 mg | ORAL_TABLET | Freq: Two times a day (BID) | ORAL | 0 refills | Status: AC

## 2023-02-14 MED ORDER — CYCLOBENZAPRINE HCL 10 MG PO TABS
5.0000 mg | ORAL_TABLET | Freq: Once | ORAL | Status: AC
  Administered 2023-02-14: 5 mg via ORAL
  Filled 2023-02-14: qty 1

## 2023-02-14 MED ORDER — LIDOCAINE 5 % EX PTCH: 1.0000 | MEDICATED_PATCH | Freq: Two times a day (BID) | CUTANEOUS | 0 refills | Status: AC

## 2023-02-14 NOTE — ED Provider Notes (Signed)
[facility] Provider Note    Event Date/Time   First MD Initiated Contact with Patient 02/14/23 1456     (approximate)   History   Neck Injury   HPI  Erika Higgins is a 27 y.o. female who presents today for evaluation of right trapezius muscle pain.  Patient reports that she did a workout for the first time in a long time recently, where she did pull downs with approximately 20 pounds of weight.  She reports that the next day she felt significant pain and tightness in her right trapezius area.  She reports that it hurts when she pushes on the area.  No pain in her neck.  She is able to move her shoulder, though says pain with doing so.  She has been able to ambulate.  She did not notice any dark-colored urine.  No chest pain or shortness of breath.  No fevers or chills.  She has not noticed any swelling or skin changes to the area.  There are no problems to display for this patient.         Physical Exam   Triage Vital Signs: ED Triage Vitals  Enc Vitals Group     BP 02/14/23 1432 116/88     Pulse Rate 02/14/23 1432 95     Resp 02/14/23 1432 18     Temp 02/14/23 1432 98.5 F (36.9 C)     Temp Source 02/14/23 1432 Oral     SpO2 02/14/23 1432 100 %     Weight 02/14/23 1436 125 lb (56.7 kg)     Height 02/14/23 1436 '5\' 3"'$  (1.6 m)     Head Circumference --      Peak Flow --      Pain Score 02/14/23 1435 7     Pain Loc --      Pain Edu? --      Excl. in [place]? --     Most recent vital signs: Vitals:   02/14/23 1432  BP: 116/88  Pulse: 95  Resp: 18  Temp: 98.5 F (36.9 C)  SpO2: 100%    Physical Exam Vitals and nursing note reviewed.  Constitutional:      General: Awake and alert. No acute distress.    Appearance: Normal appearance. The patient is normal weight.  HENT:     Head: Normocephalic and atraumatic.     Mouth: Mucous membranes are moist.  Eyes:     General: PERRL. Normal EOMs        Right eye: No discharge.        Left eye:  No discharge.     Conjunctiva/sclera: Conjunctivae normal.  Cardiovascular:     Rate and Rhythm: Normal rate and regular rhythm.     Pulses: Normal pulses.  Pulmonary:     Effort: Pulmonary effort is normal. No respiratory distress.     Breath sounds: Normal breath sounds.  Abdominal:     Abdomen is soft. There is no abdominal tenderness. No rebound or guarding. No distention. Musculoskeletal:        General: No swelling. Normal range of motion.     Cervical back: Normal range of motion and neck supple.  Right trapezius muscle tenderness without ecchymosis, erythema, crepitus, or skin changes noted.  No midline cervical spine tenderness.  Negative Spurling and Lhermitte sign.  Normal strength and sensation of bilateral upper extremities.  Normal grip strength bilaterally.  Normal intrinsic muscle function of the hands bilaterally.  Compartment soft and  compressible throughout.  Normal active and passive range of motion of bilateral shoulders, elbows, wrists, though pain with active range of motion past 90 degrees at the shoulder. Skin:    General: Skin is warm and dry.     Capillary Refill: Capillary refill takes less than 2 seconds.     Findings: No rash.  Neurological:     Mental Status: The patient is awake and alert.      ED Results / Procedures / Treatments   Labs (all labs ordered are listed, but only abnormal results are displayed) Labs Reviewed - No data to display   EKG     RADIOLOGY     PROCEDURES:  Critical Care performed:   Procedures   MEDICATIONS ORDERED IN ED: Medications  lidocaine (LIDODERM) 5 % 1 patch (1 patch Transdermal Patch Applied 02/14/23 1533)  cyclobenzaprine (FLEXERIL) tablet 5 mg (5 mg Oral Given 02/14/23 1549)     IMPRESSION / MDM / ASSESSMENT AND PLAN / ED COURSE  I reviewed the triage vital signs and the nursing notes.   Differential diagnosis includes, but is not limited to, muscle spasm, muscle strain, muscle tear, less likely  fracture or dislocation or radiculopathy.  Patient is awake and alert, hemodynamically stable and afebrile.  Her compartments are soft and compressible throughout, not consistent with compartment syndrome.  She has normal capillary refill, normal pulses.  She has normal grip strength bilaterally, normal intrinsic muscle function of the hands bilaterally.  She has no midline cervical spine tenderness.  Negative Spurling and Lhermitte sign, not consistent with radicular symptoms.  Her pain is acutely along her trapezius muscle.  We discussed the option of blood work and imaging to evaluate for muscle breakdown/bony injury, the patient declined.  She is requesting pain medications only.  She was given a Lidoderm patch, and originally I had a had ordered Toradol, however she did not want a shot, and was subsequently given Flexeril.  Her partner is driving.  Recommended no heavy lifting until this improves.  Also recommended alternating ice and warm compresses.  We discussed strict return precautions and the importance of close outpatient follow-up.  She was discharged in stable condition.   Patient's presentation is most consistent with acute complicated illness / injury requiring diagnostic workup.   FINAL CLINICAL IMPRESSION(S) / ED DIAGNOSES   Final diagnoses:  Muscle pain     Rx / DC Orders   ED Discharge Orders          Ordered    lidocaine (LIDODERM) 5 %  Every 12 hours        02/14/23 1502    naproxen (NAPROSYN) 500 MG tablet  2 times daily with meals        02/14/23 1502             Note:  This document was prepared using Dragon voice recognition software and may include unintentional dictation errors.   Emeline Gins 02/14/23 1554    Lavonia Drafts, MD 02/18/23 [phone]

## 2023-02-14 NOTE — ED Triage Notes (Addendum)
Pt worked out Sunday and woke up Monday with sever neck pain that shoots down right arm and leg. Pt denies injury. Pt last took tylenol at 1000 today

## 2023-02-14 NOTE — ED Notes (Signed)
See triage note  Presents with some pain to neck   States she developed pain when she woke up yesterday  Pain is moving into right arm and leg   Ambulates to treatment room

## 2023-02-14 NOTE — Discharge Instructions (Addendum)
Continue to alternate warm and cold compresses to the area.  You may take the medications as prescribed.  Please return for any new, worsening, or change in symptoms or other concerns.

## 2023-03-31 ENCOUNTER — Emergency Department
Admission: EM | Admit: 2023-03-31 | Discharge: 2023-03-31 | Disposition: A | Discharge status: Home / Self Care | Payer: BC Managed Care – PPO | Attending: Emergency Medicine | Admitting: Emergency Medicine

## 2023-03-31 DIAGNOSIS — E876 Hypokalemia: Secondary | ICD-10-CM | POA: Diagnosis not present

## 2023-03-31 DIAGNOSIS — G43009 Migraine without aura, not intractable, without status migrainosus: Secondary | ICD-10-CM | POA: Insufficient documentation

## 2023-03-31 DIAGNOSIS — R112 Nausea with vomiting, unspecified: Secondary | ICD-10-CM

## 2023-03-31 DIAGNOSIS — R519 Headache, unspecified: Secondary | ICD-10-CM | POA: Diagnosis present

## 2023-03-31 LAB — CBC
HCT: 42.7 % (ref 36.0–46.0)
Hemoglobin: 14.5 g/dL (ref 12.0–15.0)
MCH: 30.9 pg (ref 26.0–34.0)
MCHC: 34 g/dL (ref 30.0–36.0)
MCV: 91 fL (ref 80.0–100.0)
Platelets: 228 10*3/uL (ref 150–400)
RBC: 4.69 MIL/uL (ref 3.87–5.11)
RDW: 12.6 % (ref 11.5–15.5)
WBC: 8.9 10*3/uL (ref 4.0–10.5)
nRBC: 0 % (ref 0.0–0.2)

## 2023-03-31 LAB — COMPREHENSIVE METABOLIC PANEL
ALT: 12 U/L (ref 0–44)
AST: 17 U/L (ref 15–41)
Albumin: 4.6 g/dL (ref 3.5–5.0)
Alkaline Phosphatase: 65 U/L (ref 38–126)
Anion gap: 9 (ref 5–15)
BUN: 15 mg/dL (ref 6–20)
CO2: 24 mmol/L (ref 22–32)
Calcium: 9.5 mg/dL (ref 8.9–10.3)
Chloride: 105 mmol/L (ref 98–111)
Creatinine, Ser: 0.86 mg/dL (ref 0.44–1.00)
GFR, Estimated: >60 mL/min (ref >60)
Glucose, Bld: 90 mg/dL (ref 70–99)
Potassium: 3.2 mmol/L — ABNORMAL LOW (ref 3.5–5.1)
Sodium: 138 mmol/L (ref 135–145)
Total Bilirubin: 2.2 mg/dL — ABNORMAL HIGH (ref 0.3–1.2)
Total Protein: 7.7 g/dL (ref 6.5–8.1)

## 2023-03-31 LAB — URINALYSIS, ROUTINE W REFLEX MICROSCOPIC
Bilirubin Urine: NEGATIVE
Glucose, UA: NEGATIVE mg/dL
Hgb urine dipstick: NEGATIVE
Ketones, ur: 20 mg/dL — AB
Leukocytes,Ua: NEGATIVE
Nitrite: NEGATIVE
Protein, ur: 30 mg/dL — AB
Specific Gravity, Urine: 1.027 (ref 1.005–1.030)
pH: 5 (ref 5.0–8.0)

## 2023-03-31 LAB — PREGNANCY, URINE: Preg Test, Ur: NEGATIVE

## 2023-03-31 LAB — MAGNESIUM: Magnesium: 2.2 mg/dL (ref 1.7–2.4)

## 2023-03-31 LAB — LIPASE, BLOOD: Lipase: 23 U/L (ref 11–51)

## 2023-03-31 MED ORDER — SODIUM CHLORIDE 0.9 % IV BOLUS
1000.0000 mL | Freq: Once | INTRAVENOUS | Status: AC
  Administered 2023-03-31: 1000 mL via INTRAVENOUS

## 2023-03-31 MED ORDER — POTASSIUM CHLORIDE CRYS ER 20 MEQ PO TBCR
40.0000 meq | EXTENDED_RELEASE_TABLET | Freq: Once | ORAL | Status: AC
  Administered 2023-03-31: 40 meq via ORAL
  Filled 2023-03-31: qty 2

## 2023-03-31 MED ORDER — DROPERIDOL 2.5 MG/ML IJ SOLN
2.5000 mg | Freq: Once | INTRAMUSCULAR | Status: AC
  Administered 2023-03-31: 2.5 mg via INTRAVENOUS
  Filled 2023-03-31: qty 2

## 2023-03-31 MED ORDER — KETOROLAC TROMETHAMINE 15 MG/ML IJ SOLN
15.0000 mg | Freq: Once | INTRAMUSCULAR | Status: AC
  Administered 2023-03-31: 15 mg via INTRAVENOUS
  Filled 2023-03-31: qty 1

## 2023-03-31 MED ORDER — ONDANSETRON 4 MG PO TBDP: 4.0000 mg | ORAL_TABLET | Freq: Three times a day (TID) | ORAL | 0 refills | Status: AC | PRN

## 2023-03-31 MED ORDER — ACETAMINOPHEN 500 MG PO TABS
1000.0000 mg | ORAL_TABLET | Freq: Once | ORAL | Status: DC
  Filled 2023-03-31: qty 2

## 2023-03-31 MED ORDER — METOCLOPRAMIDE HCL 5 MG/ML IJ SOLN
10.0000 mg | Freq: Once | INTRAMUSCULAR | Status: AC
  Administered 2023-03-31: 10 mg via INTRAVENOUS
  Filled 2023-03-31: qty 2

## 2023-03-31 MED ORDER — DIPHENHYDRAMINE HCL 50 MG/ML IJ SOLN
25.0000 mg | Freq: Once | INTRAMUSCULAR | Status: AC
  Administered 2023-03-31: 25 mg via INTRAVENOUS
  Filled 2023-03-31: qty 1

## 2023-03-31 NOTE — ED Provider Notes (Signed)
[facility] Provider Note    Event Date/Time   First MD Initiated Contact with Patient 03/31/23 1645     (approximate)   History   Dehydration   HPI  Erika Higgins is a 27 y.o. female   Past medical history of marijuana use and cyclic vomiting, migraine headaches who presents to the emergency department with migraine headache and nausea and vomiting.  She continues to smoke marijuana.  Migraine headache started last night, history of frequent migraine headaches and this is similar.  No trauma or recent illnesses.  Photosensitivity.  She feels that her nausea and vomiting is relieved with capsaicin creams and has a heating pad on her abdomen which is helpful as well.  She denies any GU symptoms diarrhea or GI bleeding.  No vaginal discharge.  No other acute medical complaints.  Independent Historian contributed to assessment above: Her boyfriend is at bedside and corroborates past medical history and symptoms as above.  External Medical Documents Reviewed: July 2023 emergency department visit note for nausea vomiting relieved with droperidol      Physical Exam   Triage Vital Signs: ED Triage Vitals [03/31/23 1600]  Enc Vitals Group     BP 121/87     Pulse Rate 77     Resp 19     Temp 97.6 F (36.4 C)     Temp Source Oral     SpO2 100 %     Weight 120 lb (54.4 kg)     Height      Head Circumference      Peak Flow      Pain Score 5     Pain Loc      Pain Edu?      Excl. in GC?     Most recent vital signs: Vitals:   03/31/23 1600  BP: 121/87  Pulse: 77  Resp: 19  Temp: 97.6 F (36.4 C)  SpO2: 100%    General: Awake, no distress.  CV:  Good peripheral perfusion.  Resp:  Normal effort.  Abd:  No distention.  Other:  She is sitting in a dark room with a shirt covering her eyes.  Abdomen soft and nontender deep palpation all quadrants.  Moving all extremities motor or sensory intact.  Vital signs within normal limits, no fever.  Neck  supple with full range of motion.   ED Results / Procedures / Treatments   Labs (all labs ordered are listed, but only abnormal results are displayed) Labs Reviewed  COMPREHENSIVE METABOLIC PANEL - Abnormal; Notable for the following components:      Result Value   Potassium 3.2 (*)    Total Bilirubin 2.2 (*)    All other components within normal limits  URINALYSIS, ROUTINE W REFLEX MICROSCOPIC - Abnormal; Notable for the following components:   Color, Urine YELLOW (*)    APPearance HAZY (*)    Ketones, ur 20 (*)    Protein, ur 30 (*)    Bacteria, UA RARE (*)    All other components within normal limits  LIPASE, BLOOD  CBC  MAGNESIUM  PREGNANCY, URINE  POC URINE PREG, ED     I ordered and reviewed the above labs they are notable for ketones in the urine with rare bacteria    I independently reviewed and interpreted prior EKG obtained in June 2023 did have a normal QTc   PROCEDURES:  Critical Care performed: No  Procedures   MEDICATIONS ORDERED IN ED: Medications  droperidol (  INAPSINE) 2.5 MG/ML injection 2.5 mg (has no administration in time range)  diphenhydrAMINE (BENADRYL) injection 25 mg (has no administration in time range)  acetaminophen (TYLENOL) tablet 1,000 mg (has no administration in time range)  ketorolac (TORADOL) 15 MG/ML injection 15 mg (has no administration in time range)  metoCLOPramide (REGLAN) injection 10 mg (has no administration in time range)  potassium chloride SA (KLOR-CON M) CR tablet 40 mEq (has no administration in time range)  sodium chloride 0.9 % bolus 1,000 mL (1,000 mLs Intravenous New Bag/Given 03/31/23 1716)     IMPRESSION / MDM / ASSESSMENT AND PLAN / ED COURSE  I reviewed the triage vital signs and the nursing notes.                                Patient's presentation is most consistent with acute presentation with potential threat to life or bodily function.  Differential diagnosis includes, but is not limited to,  dehydration, electrolyte disturbance, pregnancy related, cyclic vomiting syndrome related to marijuana, intra-abdominal infection, obstruction, migraine headache, and drug cranial bleeding, CVA, vasculitis   The patient is on the cardiac monitor to evaluate for evidence of arrhythmia and/or significant heart rate changes.  MDM: This patient with chronic migraines with migraine consistent with prior, no fever, neck stiffness, other red flag symptoms to suggest more sinister pathologies like intracranial bleeding, stroke, meningitis, or other emergent infection.  Will treat migraine with Toradol, Reglan, fluids, Benadryl.  Cyclic vomiting syndrome most likely related to marijuana use, soft nontender abdomen is less suggestive of intra abdominal surgical pathology like appendicitis.  I will treat her with droperidol and fluids and reassess for symptomatic improvement at which time she can be discharged to follow-up with PMD.  Potassium slightly low will give oral repletion.  Her urinalysis shows some rare bacteria but no significant inflammatory changes there is no urinary symptoms to suggest UTI so I will defer antibiotics at this time.         FINAL CLINICAL IMPRESSION(S) / ED DIAGNOSES   Final diagnoses:  Nonintractable common migraine  Nausea and vomiting, unspecified vomiting type  Hypokalemia     Rx / DC Orders   ED Discharge Orders     None        Note:  This document was prepared using Dragon voice recognition software and may include unintentional dictation errors.    Pilar Jarvis, MD 03/31/23 [phone]

## 2023-03-31 NOTE — Discharge Instructions (Addendum)
Stop using marijuana.  Take acetaminophen 650 mg and ibuprofen 400 mg every 6 hours for pain.  Take with food.  Continue to take your Prilosec as you have been doing.  Take Zofran as needed for nausea, use as directed.   Drink plenty of fluids to stay well-hydrated.  Thank you for choosing [place] for your health care today!  Please see your primary doctor this week for a follow up appointment.   Sometimes, in the early stages of certain disease courses it is difficult to detect in the emergency department evaluation -- so, it is important that you continue to monitor your symptoms and call your doctor right away or return to the emergency department if you develop any new or worsening symptoms.  Please go to the following website to schedule new (and existing) patient appointments:   [email or web address]  If you do not have a primary doctor try calling the following clinics to establish care:  If you have insurance:  [facility] [phone] [address] [place] [place] [address]   [facility]  [phone] [address] [place] [place] [address]   If you do not have insurance:  [facility]  [phone] [address], [place] [address]   The following is another list of primary care offices in the area who are accepting new patients at this time.  Please reach out to one of them directly and let them know you would like to schedule an appointment to follow up on an Emergency Department visit, and/or to establish a new primary care provider (PCP).  There are likely other primary care clinics in the are who are accepting new patients, but this is an excellent place to start:  [facility] Lead physician: Dr Shirlee Latch [address] [place], [place] [address] [phone]  [facility] Lead Physician: Dr Alba Cory [address], [place], [place]  [address] [phone]  [facility]  Lead Physician: Dr Olevia Perches [address] [place], [place] [address] [phone]  [facility] Lead Physician: Dr Sofie Hartigan [address] [place], [place], [place] [address] [phone]  [facility] Lead Physician: Dr Bari Edward [address] [place], [place] [address] [phone]   It was my pleasure to care for you today.   Daneil Dan Modesto Charon, MD

## 2023-03-31 NOTE — ED Triage Notes (Signed)
Pt sts that she has personal issues with not eating and drinking like she should. Pt sts that she got off her schedule and since than has been vomiting and photo sensitive. Pt is eating ice while in triage and also has her head covered with a blanket. Pt is currently has a heating pad on her chest and is plugged in. Pt sts that cold water helps her feel better. 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
